# Patient Record
Sex: Male | Born: 1975
Health system: Southern US, Community
[De-identification: ages and names within clinical notes are randomized; demographics above are authoritative.]

## PROBLEM LIST (undated history)

## (undated) ENCOUNTER — Emergency Department (HOSPITAL_BASED_OUTPATIENT_CLINIC_OR_DEPARTMENT_OTHER): Admission: EM | Discharge status: Absent without leave | Payer: Self-pay

## (undated) DIAGNOSIS — G8929 Other chronic pain: Secondary | ICD-10-CM

## (undated) DIAGNOSIS — S069X9A Unspecified intracranial injury with loss of consciousness of unspecified duration, initial encounter: Secondary | ICD-10-CM

## (undated) DIAGNOSIS — M549 Dorsalgia, unspecified: Secondary | ICD-10-CM

## (undated) DIAGNOSIS — L255 Unspecified contact dermatitis due to plants, except food: Secondary | ICD-10-CM

## (undated) DIAGNOSIS — F32A Depression, unspecified: Secondary | ICD-10-CM

## (undated) DIAGNOSIS — E039 Hypothyroidism, unspecified: Secondary | ICD-10-CM

## (undated) DIAGNOSIS — G629 Polyneuropathy, unspecified: Secondary | ICD-10-CM

## (undated) DIAGNOSIS — S5290XA Unspecified fracture of unspecified forearm, initial encounter for closed fracture: Secondary | ICD-10-CM

## (undated) DIAGNOSIS — S069XAA Unspecified intracranial injury with loss of consciousness status unknown, initial encounter: Secondary | ICD-10-CM

## (undated) DIAGNOSIS — J189 Pneumonia, unspecified organism: Secondary | ICD-10-CM

## (undated) DIAGNOSIS — N200 Calculus of kidney: Secondary | ICD-10-CM

## (undated) DIAGNOSIS — M1990 Unspecified osteoarthritis, unspecified site: Secondary | ICD-10-CM

## (undated) DIAGNOSIS — F4310 Post-traumatic stress disorder, unspecified: Secondary | ICD-10-CM

## (undated) DIAGNOSIS — F329 Major depressive disorder, single episode, unspecified: Secondary | ICD-10-CM

## (undated) HISTORY — PX: OTHER SURGICAL HISTORY: SHX169

## (undated) HISTORY — DX: Pneumonia, unspecified organism: J18.9

## (undated) HISTORY — DX: Unspecified fracture of unspecified forearm, initial encounter for closed fracture: S52.90XA

## (undated) HISTORY — PX: REFRACTIVE SURGERY: SHX103

---

## 2006-10-27 ENCOUNTER — Ambulatory Visit: Payer: Self-pay | Admitting: Family Medicine

## 2006-10-29 ENCOUNTER — Ambulatory Visit: Payer: Self-pay | Admitting: Family Medicine

## 2007-04-06 ENCOUNTER — Encounter: Payer: Self-pay | Admitting: Family Medicine

## 2007-06-18 ENCOUNTER — Encounter: Payer: Self-pay | Admitting: Family Medicine

## 2007-06-18 DIAGNOSIS — I781 Nevus, non-neoplastic: Secondary | ICD-10-CM | POA: Insufficient documentation

## 2007-09-18 ENCOUNTER — Encounter: Payer: Self-pay | Admitting: Family Medicine

## 2007-09-22 ENCOUNTER — Ambulatory Visit: Payer: Self-pay | Admitting: Family Medicine

## 2007-09-22 DIAGNOSIS — R799 Abnormal finding of blood chemistry, unspecified: Secondary | ICD-10-CM

## 2007-09-22 DIAGNOSIS — R740 Nonspecific elevation of levels of transaminase and lactic acid dehydrogenase [LDH]: Secondary | ICD-10-CM

## 2007-09-22 DIAGNOSIS — E782 Mixed hyperlipidemia: Secondary | ICD-10-CM

## 2007-09-22 LAB — CONVERTED CEMR LAB
AST: 52 units/L — ABNORMAL HIGH (ref 0–37)
Albumin: 4.7 g/dL (ref 3.5–5.2)
Alkaline Phosphatase: 46 units/L (ref 39–117)
Bilirubin, Direct: <0.1 mg/dL (ref 0.0–0.3)
Total Bilirubin: 0.9 mg/dL (ref 0.3–1.2)

## 2007-09-25 LAB — CONVERTED CEMR LAB
BUN: 16 mg/dL (ref 6–23)
Chloride: 103 meq/L (ref 96–112)
LDL Cholesterol: 218 mg/dL — ABNORMAL HIGH (ref 0–99)
Phosphorus: 3.1 mg/dL (ref 2.3–4.6)
Potassium: 4.7 meq/L (ref 3.5–5.3)
Total CHOL/HDL Ratio: 4.8
VLDL: 18 mg/dL (ref 0–40)

## 2009-02-24 ENCOUNTER — Ambulatory Visit: Payer: Self-pay | Admitting: Family Medicine

## 2009-02-25 LAB — CONVERTED CEMR LAB
ALT: 111 units/L — ABNORMAL HIGH (ref 0–53)
AST: 127 units/L — ABNORMAL HIGH (ref 0–37)
Albumin: 4.5 g/dL (ref 3.5–5.2)
Bilirubin Urine: NEGATIVE
Calcium: 9.1 mg/dL (ref 8.4–10.5)
Eosinophils Relative: 4.9 % (ref 0.0–5.0)
GFR calc non Af Amer: 52.8 mL/min (ref >60)
HCT: 37.5 % — ABNORMAL LOW (ref 39.0–52.0)
Hemoglobin: 13.3 g/dL (ref 13.0–17.0)
Lymphs Abs: 2.1 10*3/uL (ref 0.7–4.0)
MCV: 101.3 fL — ABNORMAL HIGH (ref 78.0–100.0)
Monocytes Absolute: 0.3 10*3/uL (ref 0.1–1.0)
Monocytes Relative: 6.5 % (ref 3.0–12.0)
Neutro Abs: 2 10*3/uL (ref 1.4–7.7)
Platelets: 176 10*3/uL (ref 150.0–400.0)
Potassium: 4.2 meq/L (ref 3.5–5.1)
Sodium: 141 meq/L (ref 135–145)
Specific Gravity, Urine: 1.02 (ref 1.000–1.030)
Total Protein: 7.2 g/dL (ref 6.0–8.3)
Urine Glucose: NEGATIVE mg/dL
WBC: 4.6 10*3/uL (ref 4.5–10.5)
pH: 6 (ref 5.0–8.0)

## 2009-02-27 ENCOUNTER — Ambulatory Visit: Payer: Self-pay | Admitting: Family Medicine

## 2009-02-27 ENCOUNTER — Telehealth: Payer: Self-pay | Admitting: Family Medicine

## 2009-02-27 DIAGNOSIS — N259 Disorder resulting from impaired renal tubular function, unspecified: Secondary | ICD-10-CM | POA: Insufficient documentation

## 2009-02-27 LAB — CONVERTED CEMR LAB
HCV Ab: NEGATIVE
Hep A IgM: NEGATIVE
Hepatitis B Surface Ag: NEGATIVE

## 2009-02-28 ENCOUNTER — Ambulatory Visit: Payer: Self-pay | Admitting: Family Medicine

## 2009-02-28 LAB — CONVERTED CEMR LAB: Sodium, Ur: 51 meq/L

## 2009-03-02 ENCOUNTER — Encounter: Payer: Self-pay | Admitting: Family Medicine

## 2009-03-02 LAB — CONVERTED CEMR LAB
Ferritin: 148 ng/mL (ref 22.0–322.0)
Iron: 74 ug/dL (ref 42–165)
TSH: >100 microintl units/mL — ABNORMAL HIGH (ref 0.35–5.50)
Transferrin: 253.7 mg/dL (ref 212.0–360.0)

## 2009-03-08 ENCOUNTER — Encounter: Payer: Self-pay | Admitting: Family Medicine

## 2009-03-08 DIAGNOSIS — E039 Hypothyroidism, unspecified: Secondary | ICD-10-CM | POA: Insufficient documentation

## 2009-03-08 LAB — CONVERTED CEMR LAB: Free T4: 0.1 ng/dL — ABNORMAL LOW (ref 0.6–1.6)

## 2009-03-09 ENCOUNTER — Telehealth: Payer: Self-pay | Admitting: Family Medicine

## 2009-03-13 ENCOUNTER — Ambulatory Visit: Payer: Self-pay | Admitting: Cardiovascular Disease

## 2009-03-13 ENCOUNTER — Ambulatory Visit: Payer: Self-pay

## 2009-03-13 DIAGNOSIS — R9431 Abnormal electrocardiogram [ECG] [EKG]: Secondary | ICD-10-CM

## 2009-03-15 ENCOUNTER — Ambulatory Visit: Payer: Self-pay

## 2009-03-15 ENCOUNTER — Encounter: Payer: Self-pay | Admitting: Cardiovascular Disease

## 2009-03-16 ENCOUNTER — Telehealth: Payer: Self-pay | Admitting: Cardiovascular Disease

## 2009-03-16 ENCOUNTER — Telehealth: Payer: Self-pay | Admitting: Family Medicine

## 2009-03-17 ENCOUNTER — Encounter (INDEPENDENT_AMBULATORY_CARE_PROVIDER_SITE_OTHER): Payer: Self-pay | Admitting: *Deleted

## 2009-03-27 ENCOUNTER — Ambulatory Visit: Payer: Self-pay | Admitting: Family Medicine

## 2009-04-26 ENCOUNTER — Telehealth: Payer: Self-pay | Admitting: Family Medicine

## 2009-04-26 ENCOUNTER — Encounter: Payer: Self-pay | Admitting: Family Medicine

## 2009-11-14 ENCOUNTER — Telehealth: Payer: Self-pay | Admitting: Family Medicine

## 2009-11-15 ENCOUNTER — Ambulatory Visit: Payer: Self-pay | Admitting: Family Medicine

## 2009-11-20 LAB — CONVERTED CEMR LAB
AST: 24 units/L (ref 0–37)
Albumin: 4 g/dL (ref 3.5–5.2)
Alkaline Phosphatase: 85 units/L (ref 39–117)
BUN: 14 mg/dL (ref 6–23)
CO2: 33 meq/L — ABNORMAL HIGH (ref 19–32)
GFR calc non Af Amer: 90.44 mL/min (ref >60)
Glucose, Bld: 111 mg/dL — ABNORMAL HIGH (ref 70–99)
Potassium: 4.5 meq/L (ref 3.5–5.1)
TSH: 0.28 microintl units/mL — ABNORMAL LOW (ref 0.35–5.50)
Total Protein: 6.8 g/dL (ref 6.0–8.3)

## 2011-03-20 ENCOUNTER — Encounter: Payer: Self-pay | Admitting: Family Medicine

## 2011-03-21 ENCOUNTER — Ambulatory Visit (INDEPENDENT_AMBULATORY_CARE_PROVIDER_SITE_OTHER): Payer: Managed Care, Other (non HMO) | Admitting: Family Medicine

## 2011-03-21 ENCOUNTER — Other Ambulatory Visit: Payer: Managed Care, Other (non HMO)

## 2011-03-21 ENCOUNTER — Encounter: Payer: Self-pay | Admitting: Family Medicine

## 2011-03-21 DIAGNOSIS — E782 Mixed hyperlipidemia: Secondary | ICD-10-CM

## 2011-03-21 DIAGNOSIS — Z0000 Encounter for general adult medical examination without abnormal findings: Secondary | ICD-10-CM

## 2011-03-21 DIAGNOSIS — E039 Hypothyroidism, unspecified: Secondary | ICD-10-CM

## 2011-03-21 DIAGNOSIS — E789 Disorder of lipoprotein metabolism, unspecified: Secondary | ICD-10-CM

## 2011-03-21 DIAGNOSIS — N259 Disorder resulting from impaired renal tubular function, unspecified: Secondary | ICD-10-CM

## 2011-03-21 LAB — HEPATIC FUNCTION PANEL
ALT: 17 U/L (ref 0–53)
AST: 23 U/L (ref 0–37)
Albumin: 3.9 g/dL (ref 3.5–5.2)
Alkaline Phosphatase: 72 U/L (ref 39–117)
Total Protein: 6.5 g/dL (ref 6.0–8.3)

## 2011-03-21 LAB — LIPID PANEL
Cholesterol: 164 mg/dL (ref 0–200)
VLDL: 13.4 mg/dL (ref 0.0–40.0)

## 2011-03-21 LAB — BASIC METABOLIC PANEL
BUN: 19 mg/dL (ref 6–23)
CO2: 27 mEq/L (ref 19–32)
GFR: 92.96 mL/min (ref >60.00)
Glucose, Bld: 102 mg/dL — ABNORMAL HIGH (ref 70–99)
Potassium: 4.2 mEq/L (ref 3.5–5.1)

## 2011-03-21 LAB — T4, FREE: Free T4: 1.13 ng/dL (ref 0.60–1.60)

## 2011-03-21 MED ORDER — LEVOTHYROXINE SODIUM 150 MCG PO TABS: 150.0000 ug | ORAL_TABLET | Freq: Every day | ORAL | Status: DC

## 2011-03-21 NOTE — Progress Notes (Signed)
35 year old male for CPX:  Preventative Health Maintenance Visit:  Health Maintenance Summary Reviewed and updated, unless pt declines services.  Tobacco History Reviewed. Alcohol: No concerns, no excessive use Exercise Habits: Some activity, rec at least 30 mins 5 times a week STD concerns: no risk or activity to increase risk Drug Use: None Encouraged self-testicular check  The PMH, PSH, Social History, Family History, Medications, and allergies have been reviewed in [facility], and have been updated if relevant.  General: Denies fever, chills, sweats. No significant weight loss. Eyes: Denies blurring,significant itching ENT: Denies earache, sore throat, and hoarseness. Cardiovascular: Denies chest pains, palpitations, dyspnea on exertion Respiratory: Denies cough, dyspnea at rest,wheeezing Breast: no concerns about lumps GI: Denies nausea, vomiting, diarrhea, constipation, change in bowel habits, abdominal pain, melena, hematochezia GU: Denies penile discharge, ED, urinary flow / outflow problems. No STD concerns. Musculoskeletal: Denies back pain, joint pain Derm: Denies rash, itching Neuro: Denies  paresthesias, frequent falls, frequent headaches Psych: Denies depression, anxiety Endocrine: Denies cold intolerance, heat intolerance, polydipsia Heme: Denies enlarged lymph nodes Allergy: No hayfever  PE: GEN: well developed, well nourished, no acute distress Eyes: conjunctiva and lids normal, PERRLA, EOMI ENT: TM clear, nares clear, oral exam WNL Neck: supple, no lymphadenopathy, no thyromegaly, no JVD Pulm: clear to auscultation and percussion, respiratory effort normal CV: regular rate and rhythm, S1-S2, no murmur, rub or gallop, no bruits, peripheral pulses normal and symmetric, no cyanosis, clubbing, edema or varicosities Chest: no scars, masses, no gynecomastia   GI: soft, non-tender; no hepatosplenomegaly, masses; active bowel sounds all quadrants GU: no hernia, testicular  mass, penile discharge Lymph: no cervical, axillary or inguinal adenopathy MSK: gait normal, muscle tone and strength WNL, no joint swelling, effusions, discoloration, crepitus  SKIN: clear, good turgor, color WNL, no rashes, lesions, or ulcerations Neuro: normal mental status, normal strength, sensation, and motion Psych: alert; oriented to person, place and time, normally interactive and not anxious or depressed in appearance.  A/P: Well exam  The patient's preventative maintenance and recommended screening tests for an annual wellness exam were reviewed in full today. Brought up to date unless services declined.  Counselled on the importance of diet, exercise, and its role in overall health and mortality. The patient's FH and SH was reviewed, including their home life, tobacco status, and drug and alcohol status.  Follow-up on prior lab abn and repeat for wellness

## 2011-04-05 NOTE — Assessment & Plan Note (Signed)
[facility]                           [facility] OFFICE NOTE   GLENWOOD, REVOIR                             MRN:          [id number]  DATE:10/27/2006                            DOB:          [birth year]    CHIEF COMPLAINT:  A 35 year old white male here to establish new  physician.   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 He comes to clinic today to look  into some right knee pain that he has been having since an injury in  April 2007. He states at that time he was on the job and stepped out of  a moving vehicle. When this happened, he twisted his right knee  abruptly. After that, he stated, he had right knee pain with mild  swelling. He was seen by a physician at [facility], who examined  his knee on February 25, 2006. They had reported minor swelling, no  effusion, some crepitus, full range of motion. At that point in time,  ice, rest, and NSAIDs were recommended. He states that this helped  initially, but approximately 1 month ago the pain returned. He then  followed up again with [facility] on September 23, 2006. At that  point in time, he was examined and had no effusion, no joint line  tenderness, negative Lachman's, negative patellar apprehension, and full  range of motion. It was felt that it was most likely secondary to  chondromalacia patella. Quadriceps strengthening exercises, NSAIDs as  well as followup with a PCP in the [place] were recommended. Robert Mathews reports  that he continues to have pain, mainly with going up stairs. The  tenderness is primarily inferior to his right patella. He has  occasionally noticed grinding and popping with flexion. Occasionally it  has been swollen. He used to run more frequently, but has cut that back  and started using an elliptical trainer to eliminate extra stress on his  knee. He occasionally wears a brace and uses NSAIDs p.r.n. He  also takes  glucosamine and chondroitin.   REVIEW OF SYSTEMS:  Otherwise pertinent negative.   PAST MEDICAL HISTORY:  None.   HOSPITALIZATIONS, SURGERIES, PROCEDURES:  1. Pneumonia x2.  2. In 1988, radius fracture.  3. Tetanus in 2005.   ALLERGIES:  None.   MEDICATIONS:  1. Glucosamine chondroitin 1500 mg a day.  2. Multivitamins daily.   FAMILY HISTORY:  Father alive at age 33 with alcoholism and  hypertension. Mother alive at age 62 and the patient has no contact with  her and does not know past medical history. He has maternal grandfather  with heart attack and CABG as well as a paternal grandfather with  history of CABG. He denies any MI before age 29. He has several family  members with high blood pressure. A paternal grandfather has type 2  diabetes and a maternal grandmother has type 1 diabetes. He has two  sisters who are healthy. He has an aunt with breast cancer, but no other  cancer in the  family.   SOCIAL HISTORY:  He works as a Camera operator. He has been  married for 1-1/2 years and states this is going well. He has one  daughter, who is healthy at age 64. He gets exercise very frequently with  weight lifting as well as aerobic exercise 3-5 times per week. He eats  fruits, vegetables and tries to get extra protein. He drinks about 3  quarts of water per day.   PHYSICAL EXAMINATION:  VITAL SIGNS: Height 74-1/2 inches. Weight 219.  Blood pressure 108/70, pulse 68, temperature 97.7.  GENERAL: Healthy-appearing, physically fit male in no apparent distress.  HEENT: PERRLA. Extraocular muscles intact. Oropharynx clear. Tympanic  membranes clear. Nares clear. No thyromegaly. No lymphadenopathy,  cervical or supraclavicular.  CARDIOVASCULAR: Regular rate and rhythm. No murmurs, rubs or gallops.  PULMONARY: Clear to auscultation bilaterally. No wheezes, rales or  rhonchi.  ABDOMEN: Soft and nontender. Normoactive bowel sounds. No  hepatosplenomegaly.   MUSCULOSKELETAL: Strength 5/5 in upper and lower extremities. Range of  motion full in bilateral knees as well as shoulders. Tenderness to  palpation inferior to right patella, no joint line tenderness. Negative  McMurray's, negative Lachman's, negative anterior and posterior drawer,  knee joint very stable, there is crepitus with flexion. Reflexes 2+  patellar bilaterally. No patellar apprehension.  NEURO:  Cranial nerves II-XII grossly intact. Alert and oriented x3.  PSYCH: No suicidal or homicidal ideation. Appropriate affect.   ASSESSMENT AND PLAN:  1. Knee pain:  Following exam, this does seem to be most likely      secondary to patellofemoral syndrome. The patient was given      information on exercises to perform to strengthen the vastus      medialis obliquus muscle. He was also instructed to use      nonsteroidal anti-inflammatory drugs and glucosamine as needed. He      may also ice his knee as needed. He was instructed to avoid high-      impact aerobic activity. We will check an x-ray to determine that      there is no bony abnormality. If his symptoms do continue over the      next 3 months, he will return during his next leave and we can      consider looking further with an MRI, but at this point in time, I      do feel that there is no evidence of meniscal or structural injury.  2. Prevention: The patient refused flu vaccine today. He is up-to-date      with tetanus. Given his exposure overseas, we will have him return      for a PPD. He is also due for a cholesterol baseline. He has also      requested an HIV panel. All of these will be done at a separate lab      appointment, billed separately.     Robert Nora, MD  Electronically Signed    AB/MedQ  DD: 10/27/2006  DT: 10/27/2006  Job #: [id number]

## 2011-06-06 ENCOUNTER — Other Ambulatory Visit: Payer: Self-pay | Admitting: Family Medicine

## 2011-10-18 ENCOUNTER — Other Ambulatory Visit: Payer: Self-pay | Admitting: Family Medicine

## 2011-11-28 ENCOUNTER — Telehealth: Payer: Self-pay | Admitting: Internal Medicine

## 2011-11-28 NOTE — Telephone Encounter (Signed)
Patient called and said he has an appointment on the 28th of January and would like a referral for post traumatic stress and wanted you to know so you could put some names together for him.

## 2011-11-28 NOTE — Telephone Encounter (Signed)
Shirlee Limerick are you aware of any counselor or psychiatrists that specialize in this?

## 2011-11-29 NOTE — Telephone Encounter (Signed)
No I am not aware of anyone that specializes in Post Tramatic Stress, Dr Ermalene Searing. You could ask Dr Laymond Purser she might know.

## 2011-11-29 NOTE — Telephone Encounter (Signed)
Dr. Laymond Purser.. Do you know?

## 2011-12-16 ENCOUNTER — Ambulatory Visit (INDEPENDENT_AMBULATORY_CARE_PROVIDER_SITE_OTHER): Payer: 59 | Admitting: Family Medicine

## 2011-12-16 ENCOUNTER — Encounter: Payer: Self-pay | Admitting: Family Medicine

## 2011-12-16 DIAGNOSIS — E039 Hypothyroidism, unspecified: Secondary | ICD-10-CM

## 2011-12-16 DIAGNOSIS — F4310 Post-traumatic stress disorder, unspecified: Secondary | ICD-10-CM

## 2011-12-16 LAB — T3, FREE: T3, Free: 2.8 pg/mL (ref 2.3–4.2)

## 2011-12-16 NOTE — Assessment & Plan Note (Signed)
Referred to counselor.. Dr. Laymond Purser.

## 2011-12-16 NOTE — Progress Notes (Signed)
  Subjective:    Patient ID: Robert Mathews, male    DOB: June 23, 1976, 36 y.o.   MRN: [id number]  HPI  36 year old male here for follow up on hypothyroidism. On levothyroxine 150 mcg daily.  Lab Results  Component Value Date   TSH 0.13* 03/21/2011  Free T4 was normal. Had CPX with Dr. Patsy Lager 03/2011.  Has noted with cardio with lifting.Marland KitchenMarland KitchenFeeling more lightheaded, HR more elevated at 185 Ran out of 150 went back to 100 mcg... Felt better but started having mental fogginess. Went back to 150 mcg. No weight change.  He has been in [place] in the last year. Working Designer, jewellery in Visual merchandiser. Has had some issues with PTSD.Marland Kitchen [facility] Wants local referral to counselor.  Mild depression noted, on SI, no HI.  Pt doesn't want med for treatment... Just referral.     Review of Systems  Constitutional: Negative for fever and fatigue.  HENT: Negative for ear pain.   Eyes: Negative for pain.  Respiratory: Negative for shortness of breath.   Cardiovascular: Negative for chest pain, palpitations and leg swelling.  Gastrointestinal: Negative for abdominal pain.  Psychiatric/Behavioral: The patient is not nervous/anxious.        Objective:   Physical Exam  Constitutional: Vital signs are normal. He appears well-developed and well-nourished.  HENT:  Head: Normocephalic.  Right Ear: Hearing normal.  Left Ear: Hearing normal.  Nose: Nose normal.  Mouth/Throat: Oropharynx is clear and moist and mucous membranes are normal.  Neck: Trachea normal. Carotid bruit is not present. No mass and no thyromegaly present.  Cardiovascular: Normal rate, regular rhythm and normal pulses.  Exam reveals no gallop, no distant heart sounds and no friction rub.   No murmur heard.      No peripheral edema  Pulmonary/Chest: Effort normal and breath sounds normal. No respiratory distress.  Skin: Skin is warm, dry and intact. No rash noted.  Psychiatric: He has a normal mood  and affect. His speech is normal and behavior is normal. Judgment and thought content normal.          Assessment & Plan:

## 2011-12-16 NOTE — Patient Instructions (Signed)
We will call with lab results.  Stop at front desk to get referral info from [facility].

## 2011-12-16 NOTE — Assessment & Plan Note (Signed)
?   Symptoms of thyroid being overtreated.. Increased HR, dizziness.  Will re-eval TSH and free T3 and T4. May need middle dose around 125 mcg daily.

## 2011-12-18 ENCOUNTER — Telehealth: Payer: Self-pay | Admitting: Family Medicine

## 2011-12-19 MED ORDER — LEVOTHYROXINE SODIUM 125 MCG PO TABS: 125.0000 ug | ORAL_TABLET | Freq: Every day | ORAL | Status: DC

## 2011-12-19 NOTE — Progress Notes (Signed)
Addended by: Consuello Masse on: 12/19/2011 04:30 PM   Modules accepted: Orders

## 2011-12-31 ENCOUNTER — Ambulatory Visit: Payer: 59 | Admitting: Psychology

## 2012-01-01 ENCOUNTER — Other Ambulatory Visit: Payer: Self-pay | Admitting: Family Medicine

## 2012-01-01 DIAGNOSIS — E039 Hypothyroidism, unspecified: Secondary | ICD-10-CM

## 2012-01-09 ENCOUNTER — Other Ambulatory Visit: Payer: 59

## 2012-05-04 ENCOUNTER — Other Ambulatory Visit: Payer: Self-pay | Admitting: Family Medicine

## 2012-05-04 NOTE — Telephone Encounter (Addendum)
Received refill request electronically from pharmacy. Medication refill request does not match medication sheet. See office note dated 12/16/11.

## 2012-05-05 MED ORDER — LEVOTHYROXINE SODIUM 150 MCG PO TABS: 150.0000 ug | ORAL_TABLET | Freq: Every day | ORAL | Status: DC

## 2012-05-05 MED ORDER — LEVOTHYROXINE SODIUM 125 MCG PO TABS: 125.0000 ug | ORAL_TABLET | Freq: Every day | ORAL | Status: DC

## 2012-05-05 NOTE — Telephone Encounter (Signed)
He was changesd to 125 mcg daily.., due for TSH , t3 and t4 check if he is back in town.

## 2012-05-05 NOTE — Telephone Encounter (Signed)
Pt states that he never started taking the 125 mg's, continued with 150 mcg's instead, and when he ran out of those he started taking some 200 mcg's that he had on hand.  Per Dr. Ermalene Searing, advised pt to stop taking the 200's and throw them away.  150 mcgs sent to pharmacy.  Lab appt made for pt to come in in 3 weeks to recheck thyroid levels.

## 2012-05-27 ENCOUNTER — Other Ambulatory Visit: Payer: 59

## 2012-06-01 ENCOUNTER — Other Ambulatory Visit (INDEPENDENT_AMBULATORY_CARE_PROVIDER_SITE_OTHER): Payer: 59

## 2012-06-01 DIAGNOSIS — E039 Hypothyroidism, unspecified: Secondary | ICD-10-CM

## 2012-06-01 LAB — T3, FREE: T3, Free: 2.7 pg/mL (ref 2.3–4.2)

## 2012-06-03 LAB — T4, FREE: Free T4: 1.23 ng/dL (ref 0.60–1.60)

## 2013-06-02 ENCOUNTER — Other Ambulatory Visit: Payer: Self-pay | Admitting: Family Medicine

## 2013-06-29 ENCOUNTER — Other Ambulatory Visit: Payer: Self-pay | Admitting: Family Medicine

## 2013-06-29 NOTE — Telephone Encounter (Signed)
Refill request for levothyroxine.  This was sent to pharmacy on 7/16 for # 90 with 1 refill.  I told pharmacy to cancel that script and ok'd for #30 this time only, pt must have office visit before further refills.  Pt has not been seen since 11/2011.

## 2013-07-15 ENCOUNTER — Encounter: Payer: Self-pay | Admitting: Family Medicine

## 2013-07-15 ENCOUNTER — Ambulatory Visit (INDEPENDENT_AMBULATORY_CARE_PROVIDER_SITE_OTHER): Payer: 59 | Admitting: Family Medicine

## 2013-07-15 VITALS — BP 130/80 | HR 86 | Temp 98.2°F | Ht 74.0 in | Wt 224.2 lb

## 2013-07-15 DIAGNOSIS — E039 Hypothyroidism, unspecified: Secondary | ICD-10-CM

## 2013-07-15 DIAGNOSIS — M549 Dorsalgia, unspecified: Secondary | ICD-10-CM

## 2013-07-15 DIAGNOSIS — G8929 Other chronic pain: Secondary | ICD-10-CM

## 2013-07-15 NOTE — Progress Notes (Signed)
  Subjective:    Patient ID: Ethlyn Gallery, male    DOB: January 30, 1976, 37 y.o.   MRN: [id number]  HPI  37 year old male with history of hypothyroidism presents for follow up.  He denys any issues. He is doing well overall. Taking generic levo 150 mcg daily.  Lab Results  Component Value Date   TSH 0.74 06/01/2012   He is requested polio ( IPV)  Booster given he is spending time working in [place].   He is been treated by [facility] for chronic back pain.     Review of Systems  Constitutional: Negative for fever and fatigue.  HENT: Negative for ear pain.   Eyes: Negative for pain.  Respiratory: Negative for shortness of breath.   Cardiovascular: Negative for chest pain, palpitations and leg swelling.  Gastrointestinal: Negative for abdominal pain.       Objective:   Physical Exam  Constitutional: Vital signs are normal. He appears well-developed and well-nourished.  HENT:  Head: Normocephalic.  Right Ear: Hearing normal.  Left Ear: Hearing normal.  Nose: Nose normal.  Mouth/Throat: Oropharynx is clear and moist and mucous membranes are normal.  Neck: Trachea normal. Carotid bruit is not present. No mass and no thyromegaly present.  Cardiovascular: Normal rate, regular rhythm and normal pulses.  Exam reveals no gallop, no distant heart sounds and no friction rub.   No murmur heard. No peripheral edema  Pulmonary/Chest: Effort normal and breath sounds normal. No respiratory distress.  Skin: Skin is warm, dry and intact. No rash noted.  Psychiatric: He has a normal mood and affect. His speech is normal and behavior is normal. Thought content normal.          Assessment & Plan:

## 2013-07-15 NOTE — Patient Instructions (Addendum)
Stop at lab on your way out.

## 2013-07-15 NOTE — Assessment & Plan Note (Signed)
Due for re-eval. 

## 2013-07-16 ENCOUNTER — Other Ambulatory Visit: Payer: Self-pay | Admitting: *Deleted

## 2013-07-16 MED ORDER — LEVOTHYROXINE SODIUM 150 MCG PO TABS: ORAL_TABLET | ORAL | Status: DC

## 2013-07-16 NOTE — Telephone Encounter (Signed)
Instructed by Dr. Ermalene Searing to send in Rx for Levothyroxine for 1 year.  Ileana Ladd

## 2013-09-08 ENCOUNTER — Encounter: Payer: Self-pay | Admitting: Family Medicine

## 2013-09-14 ENCOUNTER — Ambulatory Visit (INDEPENDENT_AMBULATORY_CARE_PROVIDER_SITE_OTHER): Payer: 59 | Admitting: Family Medicine

## 2013-09-14 ENCOUNTER — Encounter: Payer: Self-pay | Admitting: Family Medicine

## 2013-09-14 ENCOUNTER — Ambulatory Visit (INDEPENDENT_AMBULATORY_CARE_PROVIDER_SITE_OTHER)
Admission: RE | Admit: 2013-09-14 | Discharge: 2013-09-14 | Disposition: A | Discharge status: Home / Self Care | Payer: 59 | Source: Ambulatory Visit | Attending: Family Medicine | Admitting: Family Medicine

## 2013-09-14 VITALS — BP 120/90 | HR 88 | Temp 98.2°F | Ht 74.0 in | Wt 218.5 lb

## 2013-09-14 DIAGNOSIS — M545 Low back pain: Secondary | ICD-10-CM

## 2013-09-14 DIAGNOSIS — M546 Pain in thoracic spine: Secondary | ICD-10-CM

## 2013-09-14 MED ORDER — PREDNISONE 20 MG PO TABS: 60.0000 mg | ORAL_TABLET | Freq: Every day | ORAL | Status: DC

## 2013-09-14 MED ORDER — TRAMADOL HCL ER 100 MG PO TB24: 100.0000 mg | ORAL_TABLET | Freq: Every day | ORAL | Status: DC

## 2013-09-14 MED ORDER — CYCLOBENZAPRINE HCL 10 MG PO TABS: 10.0000 mg | ORAL_TABLET | Freq: Every evening | ORAL | Status: DC | PRN

## 2013-09-14 NOTE — Patient Instructions (Signed)
Apply heat, gentle stretching exercises as tolerated. Start prednisone taper, tramadol for pain and muscle relaxant prn. We will call with X-ray results. Follow up in 2 weeks.. Call sooner if pain not improving some.

## 2013-09-14 NOTE — Progress Notes (Signed)
  Subjective:    Patient ID: Robert Mathews, male    DOB: 1975-11-29, 37 y.o.   MRN: [id number]  HPI  37 year old male with history of chronic low back pain presents with acute flare of low back pain.   I past [facility]... [facility]  Had X-rays in past there.. ? Results.   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 Low back severe pain, no radiation to legs but sore over iliac crest on right.  Hurts more sitting, bending over, jarring moton   Med evac'd out of [place] last week. Given lidocaine, torodal in back, treated with prednsione x 2 days. Helped some. Unable to do X-rays. Given 100 mg tramadol ER, he is out now.. Was helping 50% improvement. Still with constant pain and decreased ROM.  Awaiting work claim to go through. Plans to see specialist at that point.   No history of back surgery.    Review of Systems  Constitutional: Negative for fever and fatigue.  HENT: Negative for ear pain.   Eyes: Negative for pain.  Respiratory: Negative for shortness of breath.   Cardiovascular: Negative for chest pain.       Objective:   Physical Exam  Constitutional: Vital signs are normal. He appears well-developed and well-nourished.  HENT:  Head: Normocephalic.  Right Ear: Hearing normal.  Left Ear: Hearing normal.  Nose: Nose normal.  Mouth/Throat: Oropharynx is clear and moist and mucous membranes are normal.  Neck: Trachea normal. Carotid bruit is not present. No mass and no thyromegaly present.  Cardiovascular: Normal rate, regular rhythm and normal pulses.  Exam reveals no gallop, no distant heart sounds and no friction rub.   No murmur heard. No peripheral edema  Pulmonary/Chest: Effort normal and breath sounds normal. No respiratory distress.  Musculoskeletal:       Thoracic back: He exhibits decreased range of motion, tenderness and bony tenderness. He exhibits no  swelling.       Lumbar back: He exhibits decreased range of motion, tenderness and bony tenderness.  Positive SLR on right Unable to bend forward or back.  Neurological: He has normal strength. No cranial nerve deficit or sensory deficit. Gait abnormal.  Skin: Skin is warm, dry and intact. No rash noted.  Psychiatric: He has a normal mood and affect. His speech is normal and behavior is normal. Thought content normal.          Assessment & Plan:  Acvute back injury with evidence of right sided radiculopathy.  Eval with plain films.  Treat with steroids, muscle relaxant, pain med and gentle stretching and heat.  Follow up inf not improving.. Hollyanne Schloesser need referral to PT and or MRI for further eval.

## 2013-09-16 ENCOUNTER — Encounter: Payer: Self-pay | Admitting: Family Medicine

## 2013-09-23 ENCOUNTER — Other Ambulatory Visit: Payer: Self-pay

## 2013-09-27 ENCOUNTER — Encounter: Payer: Self-pay | Admitting: Family Medicine

## 2013-09-28 ENCOUNTER — Other Ambulatory Visit: Payer: Self-pay | Admitting: Family Medicine

## 2013-09-28 ENCOUNTER — Encounter: Payer: Self-pay | Admitting: Family Medicine

## 2013-09-28 DIAGNOSIS — M5416 Radiculopathy, lumbar region: Secondary | ICD-10-CM

## 2013-09-28 MED ORDER — MELOXICAM 15 MG PO TABS: 15.0000 mg | ORAL_TABLET | Freq: Every day | ORAL | Status: DC

## 2013-09-28 NOTE — Addendum Note (Signed)
Addended by: Kerby Nora E on: 09/28/2013 04:46 PM   Modules accepted: Orders

## 2013-09-29 NOTE — Telephone Encounter (Signed)
Last office visit 09/14/2013.  Ok to refill? 

## 2013-10-16 ENCOUNTER — Other Ambulatory Visit: Payer: Self-pay | Admitting: Family Medicine

## 2013-10-17 ENCOUNTER — Other Ambulatory Visit: Payer: 59

## 2013-10-17 NOTE — Telephone Encounter (Signed)
Last office visit 09/14/2013.  Ok to refill? 

## 2013-10-19 ENCOUNTER — Encounter: Payer: Self-pay | Admitting: Family Medicine

## 2013-10-19 MED ORDER — TRAMADOL HCL ER 100 MG PO TB24: 100.0000 mg | ORAL_TABLET | Freq: Every day | ORAL | Status: DC

## 2013-10-22 ENCOUNTER — Ambulatory Visit
Admission: RE | Admit: 2013-10-22 | Discharge: 2013-10-22 | Disposition: A | Discharge status: Home / Self Care | Payer: Worker's Compensation | Source: Ambulatory Visit | Attending: Family Medicine | Admitting: Family Medicine

## 2013-10-22 DIAGNOSIS — M5416 Radiculopathy, lumbar region: Secondary | ICD-10-CM

## 2013-10-26 ENCOUNTER — Other Ambulatory Visit: Payer: Self-pay | Admitting: Family Medicine

## 2013-11-01 ENCOUNTER — Other Ambulatory Visit: Payer: Self-pay | Admitting: Family Medicine

## 2013-11-01 ENCOUNTER — Telehealth: Payer: Self-pay

## 2013-11-01 NOTE — Telephone Encounter (Signed)
Last office visit 09/14/2013.  Ok to refill? 

## 2013-11-01 NOTE — Telephone Encounter (Signed)
Robert Mathews case Production designer, theatre/television/film with worker's comp request written order for physical therapy faxed to [facility] at [phone].

## 2013-11-02 NOTE — Telephone Encounter (Signed)
In outbox

## 2013-11-02 NOTE — Telephone Encounter (Signed)
PT orders faxed to [facility] at [phone].

## 2013-11-15 ENCOUNTER — Ambulatory Visit (INDEPENDENT_AMBULATORY_CARE_PROVIDER_SITE_OTHER): Payer: Worker's Compensation | Admitting: Family Medicine

## 2013-11-15 ENCOUNTER — Encounter: Payer: Self-pay | Admitting: Family Medicine

## 2013-11-15 VITALS — BP 140/94 | HR 78 | Temp 98.1°F | Ht 74.0 in | Wt 227.0 lb

## 2013-11-15 DIAGNOSIS — M545 Low back pain: Secondary | ICD-10-CM

## 2013-11-15 DIAGNOSIS — F4321 Adjustment disorder with depressed mood: Secondary | ICD-10-CM | POA: Insufficient documentation

## 2013-11-15 NOTE — Assessment & Plan Note (Signed)
We discussed options , he will proceed with PT. Follow up in several weeks, if not improving by then or earlier if pt interested we will refer for epidural spinal injections.  Continue tramadol ER an meloxicam for pain and inflammation.

## 2013-11-15 NOTE — Progress Notes (Signed)
Pre-visit discussion using our clinic review tool. No additional management support is needed unless otherwise documented below in the visit note.  

## 2013-11-15 NOTE — Patient Instructions (Signed)
Stop at front desk to discuss referral with referral specialist [facility]. Follow up in 3-4 weeks.

## 2013-11-15 NOTE — Assessment & Plan Note (Signed)
If not improved with return to acitvity at [facility]... We will refer to counselor.

## 2013-11-15 NOTE — Progress Notes (Signed)
Subjective:    Patient ID: Robert Mathews, male    DOB: 08-08-1976, 37 y.o.   MRN: [id number]  HPI 36 year old male with avute lumbar spine injury now chronic  given ongoing x 2 months presents for discussusion of MRi spine.   Hx is as follows.. 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 Low back severe pain, no radiation to legs but sore over iliac crest on right.  Hurts more sitting, bending over, jarring moton  Med evac'd out of [place] the following week.  Given lidocaine, torodal in back, treated with prednsione x 2 days. Helped some.  Unable to do X-rays.  Given 100 mg tramadol ER  No history of back surgery.  Xray lumbar 10/28 : IMPRESSION:  Mild degenerative disc disease changes lower lumbar spine.  No acute abnormalities.  He has been maintained with tramadol ER for pain following a course of prednisone for pain.  MRI lumbar 12/5: IMPRESSION:  1. Mild to moderate lateral recess and foraminal narrowing  bilaterally at L3-4.  2. Mild central canal stenosis at L4-5 with mild to moderate  foraminal stenosis bilaterally, worse on the right.  3. Moderate foraminal stenosis bilaterally at L5-S1 without  significant central canal stenosis.  4. Chronic endplate changes and degenerative loss of endplate height  at L4-5.  In the following weeks  He has not been able to start PT yet.  he presents with Olena Heckle from his job for worker comp. Today he reports:  The he has a pain level of 4/10  As long as he takes tramadol daily as well as the meloxicam. Pain worse with twisting  Side to side, walking down stairs. Continued tingling on left leg, no new weakness. No incontinence, no fever, no perineal numbness.    Since he  Has been inactive.. He has been experiencing decreased motivation, short tempered, moody.  he has been sad due to passing of  grandfather as well.  No anhedonia. No SI, no HI. Some insomnia that he feels is due to the tramadol.  If these symptoms do not improve with physical exercise at [facility], then we will refer him to psychologist.  Never on any med for mood.    Review of Systems  Constitutional: Negative for fatigue.  HENT: Negative for ear pain.   Eyes: Negative for pain.  Respiratory: Negative for cough and shortness of breath.   Cardiovascular: Negative for chest pain.  Gastrointestinal: Negative for abdominal pain.       He has gained 10 lbs since last OV given inactivity.       Objective:   Physical Exam  Constitutional: Vital signs are normal. He appears well-developed and well-nourished.  HENT:  Head: Normocephalic.  Right Ear: Hearing normal.  Left Ear: Hearing normal.  Nose: Nose normal.  Mouth/Throat: Oropharynx is clear and moist and mucous membranes are normal.  Neck: Trachea normal. Carotid bruit is not present. No mass and no thyromegaly present.  Cardiovascular: Normal rate, regular rhythm and normal pulses.  Exam reveals no gallop, no distant heart sounds and no friction rub.   No murmur heard. No peripheral edema  Pulmonary/Chest: Effort normal and breath sounds normal. No respiratory distress.  Musculoskeletal:       Thoracic back: He exhibits normal range of motion, no tenderness, no bony tenderness and no swelling.  Lumbar back: He exhibits decreased range of motion, tenderness and bony tenderness.  negative SLR on right Unable to bend forward or back.  Neurological: He has normal strength. He displays no atrophy. No cranial nerve deficit or sensory deficit. He exhibits normal muscle tone. Gait abnormal. Coordination normal.  Skin: Skin is warm, dry and intact. No rash noted.  Psychiatric: He has a normal mood and affect. His speech is normal and behavior is normal. Thought content normal.          Assessment & Plan:

## 2013-11-16 ENCOUNTER — Encounter: Payer: Self-pay | Admitting: Family Medicine

## 2013-11-16 ENCOUNTER — Telehealth: Payer: Self-pay | Admitting: Family Medicine

## 2013-11-16 NOTE — Telephone Encounter (Signed)
Pt dropped off form to be filled out. I put them in Dr. Senaida Lange inbox on her desk.

## 2013-11-16 NOTE — Telephone Encounter (Signed)
Will complete in next few days.

## 2013-11-19 ENCOUNTER — Encounter: Payer: Self-pay | Admitting: Family Medicine

## 2013-11-19 DIAGNOSIS — M5416 Radiculopathy, lumbar region: Secondary | ICD-10-CM | POA: Insufficient documentation

## 2013-11-19 DIAGNOSIS — IMO0002 Reserved for concepts with insufficient information to code with codable children: Secondary | ICD-10-CM | POA: Insufficient documentation

## 2013-11-19 DIAGNOSIS — Z0279 Encounter for issue of other medical certificate: Secondary | ICD-10-CM

## 2013-11-19 DIAGNOSIS — M48061 Spinal stenosis, lumbar region without neurogenic claudication: Secondary | ICD-10-CM | POA: Insufficient documentation

## 2013-11-22 ENCOUNTER — Encounter: Payer: Self-pay | Admitting: Family Medicine

## 2013-11-23 MED ORDER — TRAMADOL HCL ER 100 MG PO TB24: 100.0000 mg | ORAL_TABLET | Freq: Every day | ORAL | Status: DC

## 2013-11-23 MED ORDER — MELOXICAM 15 MG PO TABS: ORAL_TABLET | ORAL | Status: DC

## 2013-11-23 NOTE — Telephone Encounter (Signed)
Tramadol called to [facility] [address] 

## 2013-12-02 ENCOUNTER — Telehealth: Payer: Self-pay

## 2013-12-02 NOTE — Telephone Encounter (Signed)
Pat with [facility] left v/m requesting order for Dexamethasone. Dennie Bibleat has already faxed form and Dexamethasone prescription for signature from [facility].

## 2013-12-14 ENCOUNTER — Ambulatory Visit: Payer: Worker's Compensation | Admitting: Family Medicine

## 2013-12-20 ENCOUNTER — Telehealth: Payer: Self-pay | Admitting: *Deleted

## 2013-12-20 DIAGNOSIS — M545 Low back pain, unspecified: Secondary | ICD-10-CM

## 2013-12-20 DIAGNOSIS — M549 Dorsalgia, unspecified: Principal | ICD-10-CM

## 2013-12-20 DIAGNOSIS — G8929 Other chronic pain: Secondary | ICD-10-CM

## 2013-12-20 DIAGNOSIS — M5416 Radiculopathy, lumbar region: Secondary | ICD-10-CM

## 2013-12-20 DIAGNOSIS — IMO0002 Reserved for concepts with insufficient information to code with codable children: Secondary | ICD-10-CM

## 2013-12-20 DIAGNOSIS — M48061 Spinal stenosis, lumbar region without neurogenic claudication: Secondary | ICD-10-CM

## 2013-12-20 NOTE — Telephone Encounter (Signed)
Referral sent 

## 2013-12-20 NOTE — Telephone Encounter (Signed)
[facility] calling stating that pt is having a hard time with physical therapy and would like referral to an orthopedist. Please advise

## 2013-12-22 ENCOUNTER — Encounter: Payer: Self-pay | Admitting: Family Medicine

## 2013-12-22 ENCOUNTER — Other Ambulatory Visit: Payer: Self-pay | Admitting: Family Medicine

## 2013-12-22 NOTE — Telephone Encounter (Signed)
Last office visit 11/15/2013.  Ok to refill? 

## 2013-12-23 MED ORDER — TRAMADOL HCL ER 100 MG PO TB24: 100.0000 mg | ORAL_TABLET | Freq: Every day | ORAL | Status: DC

## 2013-12-23 NOTE — Telephone Encounter (Signed)
Called to [facility] [address] 

## 2013-12-30 ENCOUNTER — Ambulatory Visit: Payer: Worker's Compensation | Admitting: Family Medicine

## 2014-01-10 ENCOUNTER — Encounter: Payer: Self-pay | Admitting: Family Medicine

## 2014-01-10 DIAGNOSIS — F329 Major depressive disorder, single episode, unspecified: Secondary | ICD-10-CM

## 2014-01-11 NOTE — Telephone Encounter (Signed)
The referral can be processed through my case manager, Maxie BetterAmanda Ratcliff, [phone] or [phone].

## 2014-01-19 ENCOUNTER — Encounter: Payer: Self-pay | Admitting: Family Medicine

## 2014-01-19 ENCOUNTER — Other Ambulatory Visit: Payer: Self-pay | Admitting: Family Medicine

## 2014-01-19 NOTE — Telephone Encounter (Signed)
Last office visit 11/15/2013.  Ok to refill? 

## 2014-01-20 MED ORDER — TRAMADOL HCL ER 100 MG PO TB24: 100.0000 mg | ORAL_TABLET | Freq: Every day | ORAL | Status: DC

## 2014-01-20 NOTE — Telephone Encounter (Signed)
Called to [facility] [address] 

## 2014-02-10 ENCOUNTER — Telehealth: Payer: Self-pay

## 2014-02-10 NOTE — Telephone Encounter (Signed)
[employer] notified patient is currently taking cyclobenzaprine, meloxicam and tramadol.

## 2014-02-10 NOTE — Telephone Encounter (Signed)
[facility] left v/m; wanting to know if pt is currently taking cyclobenzaprine, meloxicam and tramadol.Please advise.

## 2014-02-10 NOTE — Telephone Encounter (Signed)
Yes, pt is currently taking those medications.

## 2014-02-16 ENCOUNTER — Encounter: Payer: Self-pay | Admitting: Family Medicine

## 2014-02-16 ENCOUNTER — Other Ambulatory Visit: Payer: Self-pay | Admitting: Family Medicine

## 2014-02-16 NOTE — Telephone Encounter (Signed)
Last office visit 11/15/2013.  Ok to refill? 

## 2014-02-17 MED ORDER — TRAMADOL HCL ER 100 MG PO TB24: 100.0000 mg | ORAL_TABLET | Freq: Every day | ORAL | Status: DC

## 2014-02-17 NOTE — Telephone Encounter (Signed)
Called to [facility]. 

## 2014-03-24 ENCOUNTER — Telehealth: Payer: Self-pay

## 2014-03-24 NOTE — Telephone Encounter (Signed)
Left message for [facility] return my call.  We have been waiting on Robert Mathews to get a referral to a neurosurgeon for his back thru workers comp.  Robert Mathews has been diagnosed with: Acute Back Pain, Foraminal Stenosis of Lumbar Region, DDD & Radiculopathy of Lumbar Region.         He is being seen today by a Dr. Venetia MaxonStern.  Cyclobenzaprine, Meloxicam and Tramadol are being prescribed by Dr. Ermalene SearingBedsole until Robert Mathews could be seen by a surgeon.  I see no record of us prescribing Gabapentin for Robert Mathews.

## 2014-03-24 NOTE — Telephone Encounter (Signed)
Robert Mathews with [employer] left v/m;In v/m Robert Mathews said she is aware pt was last seen on 11/15/2013 and does not have future appt. Pt has been getting monthly refills of Tramadol,Meloxicam,Cyclobenzaprine and Gabapentin.  Robert Mathews wants cb to explain why meds being dispensed monthly without pt being seen.

## 2014-03-24 NOTE — Telephone Encounter (Signed)
We are unable to communicate about pt care with this person without pt consent. Call pt if pain not improving and not followed by specialist make appt to be re-evaluated.

## 2014-03-29 ENCOUNTER — Encounter: Payer: Self-pay | Admitting: Family Medicine

## 2014-03-29 ENCOUNTER — Other Ambulatory Visit: Payer: Self-pay | Admitting: Neurosurgery

## 2014-03-29 DIAGNOSIS — M5416 Radiculopathy, lumbar region: Secondary | ICD-10-CM

## 2014-03-30 NOTE — Telephone Encounter (Signed)
Please look into this and cancel if need be.

## 2014-04-26 ENCOUNTER — Other Ambulatory Visit: Payer: Self-pay

## 2014-05-02 ENCOUNTER — Ambulatory Visit
Admission: RE | Admit: 2014-05-02 | Discharge: 2014-05-02 | Disposition: A | Discharge status: Home / Self Care | Payer: Worker's Compensation | Source: Ambulatory Visit | Attending: Neurosurgery | Admitting: Neurosurgery

## 2014-05-02 DIAGNOSIS — M5416 Radiculopathy, lumbar region: Secondary | ICD-10-CM

## 2014-07-10 ENCOUNTER — Other Ambulatory Visit: Payer: Self-pay | Admitting: Family Medicine

## 2014-07-10 NOTE — Telephone Encounter (Signed)
Last office visit 11/15/2013.  Ok to refill? 

## 2014-11-04 ENCOUNTER — Other Ambulatory Visit: Payer: Self-pay | Admitting: Family Medicine

## 2014-11-04 NOTE — Telephone Encounter (Signed)
Last office visit 11/15/2013.  Last refilled 07/11/2014 for #30 with 2 refills.  Ok to refill?

## 2015-04-29 IMAGING — CT CT L SPINE W/O CM
4 of 9 series · 9 of 20 positions shown, 10 images · non-contrast
Comparison: MR 10/22/2013

CLINICAL DATA: Lifting injury.  Post discogram.

EXAM:
CT LUMBAR SPINE WITHOUT CONTRAST
TECHNIQUE: Multidetector CT imaging of the lumbar spine was performed without
intravenous contrast administration. Multiplanar CT image
reconstructions were also generated.

[Series 2: l spine bone · axial · 0.27mm/px · z∈[-314,-252]mm · 2 of 75 slices shown]
[im 25/75  bone]
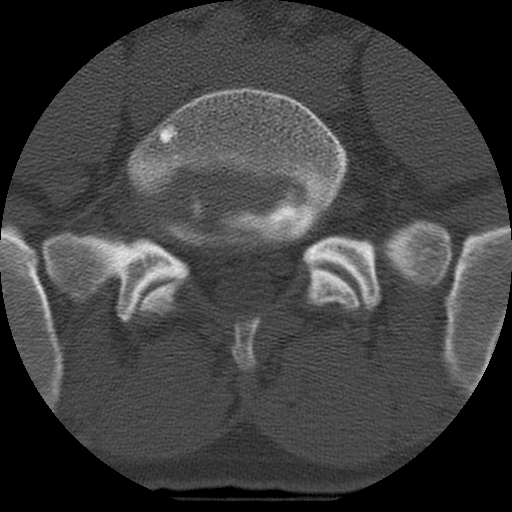
[im 50/75  bone]
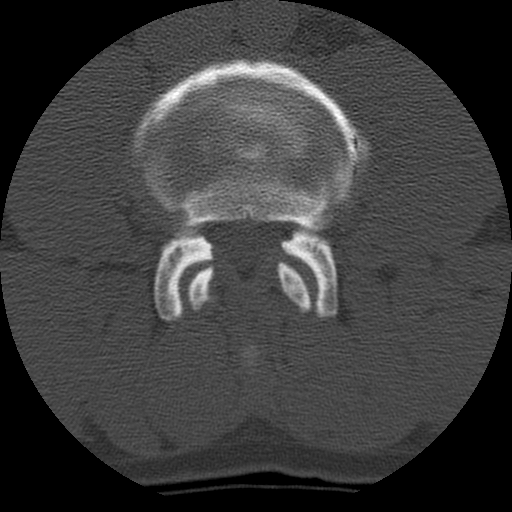

[Series 3: l spine soft · axial · 0.27mm/px · z∈[-314,-252]mm · 2 of 75 slices shown]
[im 25/75  soft-tissue]
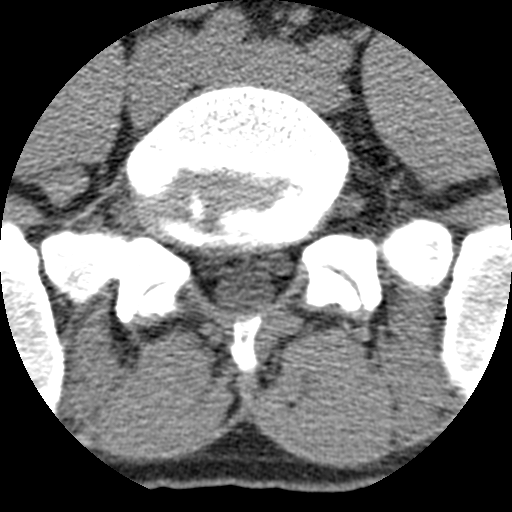
[im 50/75  soft-tissue]
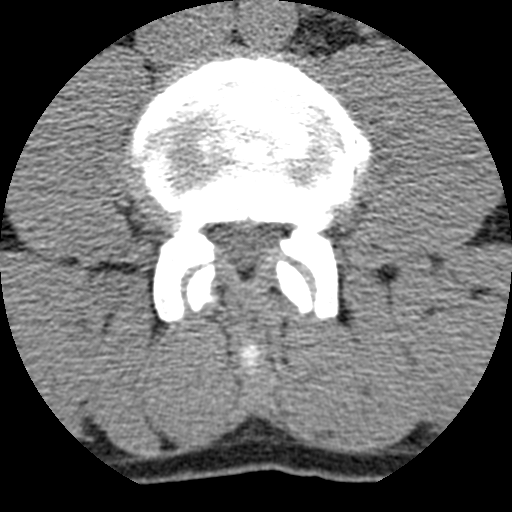

[Series 106: axial · axial · 0.25mm/px · z∈[-408,-187]mm · 3 of 51 slices shown, 4 images]
[im 1/51  soft-tissue]
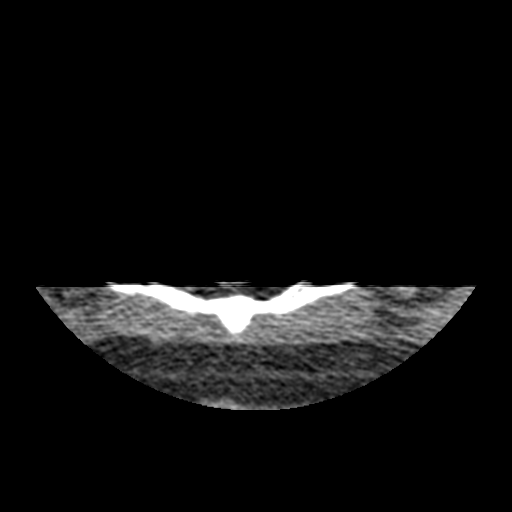
[im 1/51  bone]
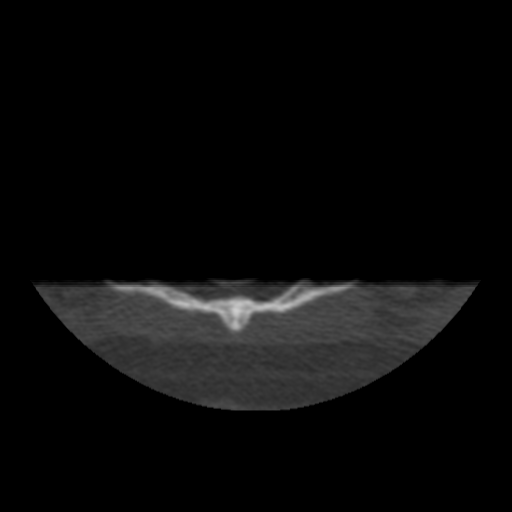
[im 26/51  bone]
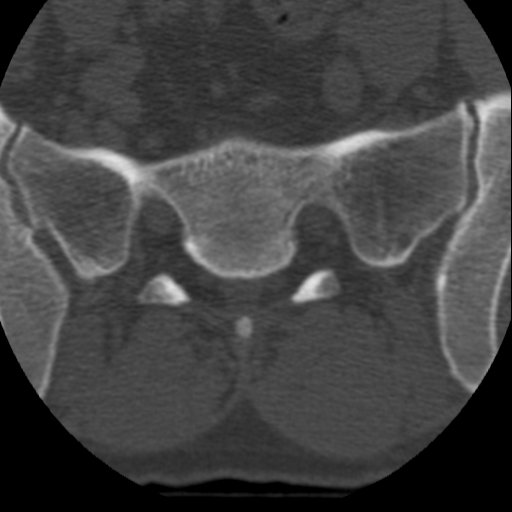
[im 51/51  bone]
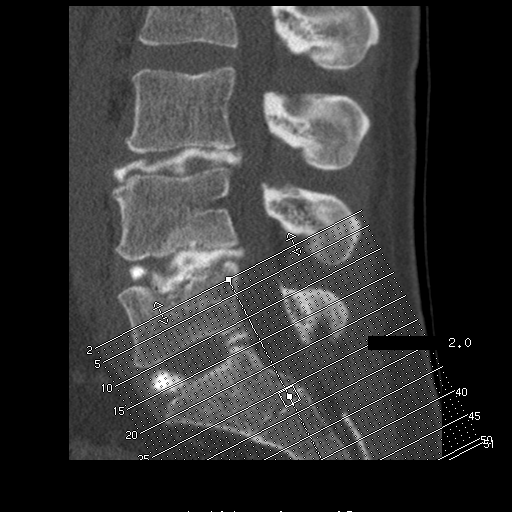

[Series 401: cor lower · coronal · 0.37mm/px · 2 of 61 slices shown]
[im 2/61  bone]
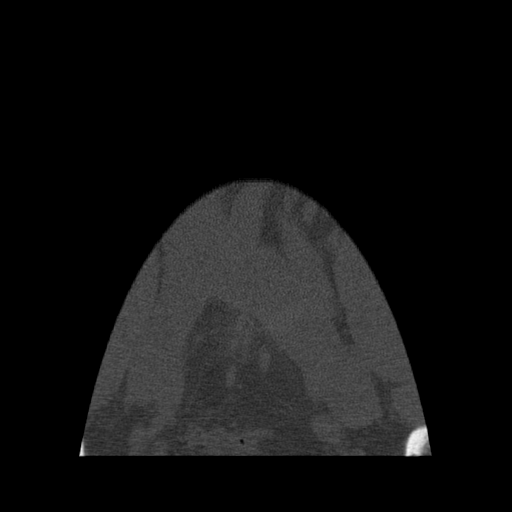
[im 60/61  bone]
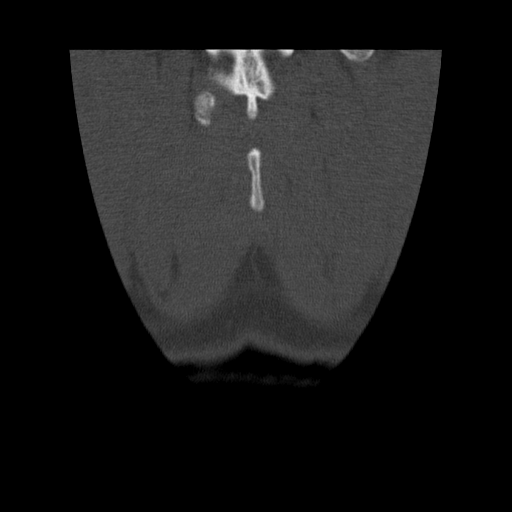

[9 of 20 positions shown; findings below may reference images not displayed]

FINDINGS: Numbering scheme follows that used on prior study.

L2-3: No significant disc bulge or protrusion. Bilateral facet DJD
right greater than left. Central canal and foramina patent.

L3-4: Moderate circumferential disc bulge. Diffuse annular
degeneration with extensive contrast extension into the annulus
circumferentially, sparing only the right posterolateral region.
There is mild bilateral facet DJD contributing to the mild spinal
stenosis and bilateral subarticular recess narrowing

L4-5: Extensive annular degeneration. Circumferential disc bulge
with extensive contrast extension into the anulus posteriorly.
Prominent Schmorl's node in the superior endplate of L5. No definite
spinal or foraminal stenosis.

L5-S1: There is some dense contrast in the annulus anteriorly on the
left, with a paucity of nuclear contrast, suggesting suboptimal
needle placement for discography. There is a mild circumferential
disc bulge with small right posterolateral protrusion. There is
annular contrast throughout most of the segment. No spinal stenosis.
Mild bilateral foraminal encroachment.
IMPRESSION: 1. Disc bulge L3-4 with diffuse annular degeneration.
2. Disc bulge L4-5 with extensive annular degeneration.
3. Suboptimal contrast distribution at L5-S1 with disc bulge and
small right posterolateral protrusion.

## 2015-09-19 HISTORY — PX: CARPAL TUNNEL RELEASE: SHX101

## 2015-10-18 ENCOUNTER — Other Ambulatory Visit: Payer: Self-pay | Admitting: Neurosurgery

## 2015-10-21 NOTE — H&P (Signed)
Patient ID:   [id number] Patient: Robert Mathews  Date of Birth: 08/09/1976 Visit Type: Office Visit   Date: 09/29/2015 10:30 AM Provider: Nathanial Millman. Bartko MD   This 39 year old male presents for pain.  History of Present Illness: 1.  pain  Patient was seen in follow-up today with chief complaint of chronic low back pain.  My assistant Caryl Pina attended throughout as a chaperone.  As review, he's had severe, disabling chronic low back pain that has been refractory to extensive conservative measures including therapy, injections and multiple adjuvants.  He has been working with Dr. Thayer Ohm regarding his depression and PTSD and has made substantial gains to the extent that he was determined to be an appropriate candidate for spinal cord stimulator trial from a psychologic standpoint.  We did undertake his trial on 09/26/15 with 2 paramedian leads at [facility].  He indicates that he's had no issues whatsoever with the trial.  Denies any fevers, chills or any other new symptoms such as any new numbness or weakness.  According to him, he's had a tremendous response.  He indicates that when utilizing his pain is down to about a one which she feels is overall about 75% improvement of both back and leg pain.  He is very pleased with his response and wishes to proceed with a permanent placement which was discussed with him today as well.  He had some questions about that there were answered.  As far as adjuvants, he continues with the Lidoderm.  He does not need any refills.  As far as his depression and PTSD, continues to work with Dr. Thayer Ohm and overall doing significant only better.  Dr. Ileene Patrick notes indicate that he could benefit from going up on his Zoloft to 200 mg daily.  We discussed this.  He is on 150 mg and seems to be tolerating it well without any side effects.  Upon review of his medications, he is on nothing else that would predispose him to a serotonin syndrome on a higher dose.  I think that's reasonable.   Prescription was rendered.      Medical/Surgical/Interim History Reviewed, no change.  Last detailed document date:04/04/2015.   PAST MEDICAL HISTORY, SURGICAL HISTORY, FAMILY HISTORY, SOCIAL HISTORY AND REVIEW OF SYSTEMS I have reviewed the patient's past medical, surgical, family and social history as well as the comprehensive review of systems as included on the [facility] history form dated 09/29/2015, which I have signed.  Family History: Reviewed, no changes.  Last detailed document: 04/04/2015.   Social History: Tobacco use reviewed. Reviewed, no changes. Last detailed document date: 04/04/2015.      MEDICATIONS(added, continued or stopped this visit): Started Medication Directions Instruction Stopped   cyclobenzaprine 10 mg tablet take 1 tablet by oral route  every day     hydroxyzine HCl 25 mg tablet take 1 tablet by oral route 4 times every day     levothyroxine 150 mcg tablet take 1 tablet by oral route  every day    04/04/2015 Lidoderm 5 % adhesive patch apply 1 patch by transdermal route 3 times every day (May wear up to 12hours.)    01/18/2015 meloxicam 15 mg tablet take 1 tablet by oral route  every day     sertraline 50 mg tablet take 1 tablet by oral route  every day    01/18/2015 tramadol ER 100 mg tablet,extended release 24 hr take 1 tablet by oral route 2 times every day  09/29/2015 Zoloft 100 mg tablet take 2 tablets by oral route  every day       ALLERGIES: Ingredient Reaction Medication Name Comment  TOPIRAMATE Shortness Of Breath       REVIEW OF SYSTEMS System Neg/Pos Details  Constitutional Negative Chills, fever, night sweats and weight loss.  GU Negative Urinary incontinence and urinary retention.  Neuro Negative Extremity weakness, numbness in extremity and no new/incr. numbness/wkness except noted in hopi.  All other review of systems are negative.    Vitals Date Temp F BP Pulse Ht In Wt Lb BMI BSA Pain  Score  09/29/2015  143/90 71 74 234 30.04  1/10     PHYSICAL EXAM General Level of Distress: no acute distress Overall Appearance: normal    Cardiovascular  Right Left  Peripheral Pulses: normal (radial/pedal); no edema normal(radial/pedal); no edema   Neurological Orientation: normal Recent and Remote Memory: normal Attention Span and Concentration:   normal Language: normal Fund of Knowledge: normal; insight/judgement nl  Right Left Sensation: normal normal Upper Extremity Coordination: normal normal  Lower Extremity Coordination: normal normal  Musculoskeletal Gait and Station: normal  Right Left Lower Extremity Muscle Strength: normal normal Lower Extremity Muscle Tone: normal normal  Motor Strength Lower extremity motor strength was tested in the clinically pertinent muscles.     Deep Tendon Reflexes  Right Left Patellar: normal normal Achilles: normal normal  Sensory Sensation was tested at L1 to S1.   Motor and other Tests    Right Left Clonus: absent absent SLR: negative negative SI Joint: nontender nontender   Additional Findings:  Skin Inspection/palpation:  Warm to touch.  No rashes, lesions or subcutaneous nodules. HENT: Hearing intact to voice.  Inspection of the lips, gums and teeth unremarkable.  External inspection of the ears and nose unremarkable. Eyes: Conjunctiva/lids inspected- symmetric without ptosis, injection.  PERRLA/EOMI.  MSK: Inspection/palpation digits and nails: Without clubbing, cyanosis, pitting.            Spine/Pevis:  Without instability, laxity or luxation.  Without asymmetry or misalignment, masses or defects.  Paraspinals with grossly normal strength without obvious atrophy. Essentially normal to inspection and palpation with no vertebral body spinous process gapping or tenderness except for < some tenderness in the lower lumbar paraspinals.>.  ROM reveals < unchanged>.    IMPRESSION 1.)  Chronic low back pain  with exceedingly successful spinal cord stimulator trial as noted above.  He would like to have permanent placement and we will refer him back to Dr. Vertell Limber for evaluation for that.  All his questions were answered.  In the meantime he will continue his Lidoderm..  2.)  <PTSD and depression.  Overall significantly improved.  He will continue psychotherapy and we will titrate his Zoloft as noted above.  Case manager was brought in with his approval and the above was reviewed. >.  3.)  < >.  4.)  < >.  5.)  < >.    6.)  < >.  Completed Orders (this encounter) Order Details Reason Side Interpretation Result Initial Treatment Date Region  Lifestyle education regarding diet Patient is encouraged to eat a well balanced diet.        Hypertension education Continue to monitor blood pressure. If Blood pressure remains elevated, follow up with Primary care.         Assessment/Plan # Detail Type Description   1. Assessment Spondylosis of lumbar region without myelopathy or radiculopathy (M47.816).       2. Assessment Depression,  unspecified depression type (F32.9).       3. Assessment PTSD (post-traumatic stress disorder) (F43.10).       4. Assessment Back pain associated with peripheral numbness (M54.9).   Plan Orders Referral to Erline Levine MD for Surgery       5. Assessment Body mass index (BMI) 30.0-30.9, adult (Z68.30).   Plan Orders Today's instructions / counseling include(s) Lifestyle education regarding diet.       6. Assessment Elevated blood-pressure reading, w/o diagnosis of htn (R03.0).         Pain Assessment/Treatment Pain Scale: 1/10. Method: Numeric Pain Intensity Scale. Location: back. Onset: 08/23/2013. Duration: varies. Quality: discomforting.  Fall Risk Plan The patient has not fallen in the last year.   Orders: Office Procedures/Services: Assessment Service Comments   Change Zoloft to 100 mg, 2 tablets by mouth daily, #60 with 3 refills     Instruction(s)/Education: Assessment Instruction  R03.0 Hypertension education  Z68.30 Lifestyle education regarding diet    MEDICATIONS PRESCRIBED TODAY    Rx Quantity Refills  ZOLOFT 100 mg  60 3            Provider:  Nathanial Millman. Bartko MD  09/29/2015 11:48 AM Dictation edited by: Nathanial Millman. Brien Few III    CC Providers: [place], [place] [id number]-              Electronically signed by Nathanial Millman. Bartko MD on 09/29/2015 11:49 AM  > [place], [place] [address] Phone: [phone]   Patient ID:   [id number] Patient: Robert Mathews  Date of Birth: 12/29/1974 Visit Type: Office Visit   Date: 02/21/2014 09:30 AM Provider: Marchia Meiers. Vertell Limber MD   This 39 year old male presents for back pain.  History of Present Illness: 1.  back pain  Robert Mathews, [phone]. male, visits reporting lumbar pain with new RLE tingling and numbness.  Pt reports long hx lumbar pain since [employer].  Some increased pain after thrown from treadmill 6yr ago, significant increase in lumbar pain after squats in gym Oct 2014. RLE numbness, tingling with PT.   Tramadol ER 1061mqAM Flexeril 106m'@HS'  Mobic 20m62mM  Hx: PTSD, Hypothyroid  MRI, X-ray on [facility]  Patient notes that he has low back pain and right leg pain but that this is increased since October 2014.  He has done physical therapy including iontophoresis and says that his degree of pain has interfered with his ability to function and that he is quite frustrated with this.  He normally uses exercise as an outlet and has not been able to do so because of his back pain.  He says he had to go on Zoloft with increase in his PTSD symptoms with decreased activity.  He says the pain varies and it is 2 out of 10 on tramadol in 4-6 out of 10 with bending to 8 out of 10 without tramadol.  He says that physical therapy made the "neurological stuff get worse" he says that sitting  for greater than 30 minutes causes back spasms.  He describes a long history of low level low back pain but that this is much worse since his recent injury while performing squats in the gym, which was required by his employer.  Imaging studies demonstrate degenerative changes at the L4 L5 level with disc degeneration and Modic endplate changes at this level.  He has 5 well aligned lumbar vertebrae on plain radiographs with degenerative  changes at the L4 L5 level without spondylolisthesis.        PAST MEDICAL/SURGICAL HISTORY   (Detailed)  Disease/disorder Onset Date Management Date Comments  Thyroid disease          PAST MEDICAL HISTORY, SURGICAL HISTORY, FAMILY HISTORY, SOCIAL HISTORY AND REVIEW OF SYSTEMS I have reviewed the patient's past medical, surgical, family and social history as well as the comprehensive review of systems as included on the [facility] history form dated 02/21/2014, which I have signed.  Family History  (Detailed)  Relationship Family Member Name Deceased Age at Death Condition Onset Age Cause of Death      Family history of Hypertension  N   SOCIAL HISTORY  (Detailed) Tobacco use reviewed. Preferred language is Unknown.   Smoking status: Never smoker.  SMOKING STATUS Use Status Type Smoking Status Usage Per Day Years Used Total Pack Years  no/never  Never smoker             MEDICATIONS(added, continued or stopped this visit):   Started Medication Directions Instruction Stopped   cyclobenzaprine 10 mg tablet take 1 tablet by oral route  every day     levothyroxine 150 mcg tablet take 1 tablet by oral route  every day     meloxicam 15 mg tablet take 1 tablet by oral route  every day    02/21/2014 Neurontin 100 mg capsule take 1 capsule by oral route 3 times every day x3 days, then 2 TIDx3 days, then 3 TID     sertraline 50 mg tablet take 1 tablet by oral route  every day    02/21/2014 tizanidine 2 mg tablet 1-2  po q 6hrs prn spasm     tramadol ER 100 mg tablet,extended release 24 hr take 1 tablet by oral route  every day      ALLERGIES:  Ingredient Reaction Medication Name Comment  NO KNOWN ALLERGIES     No known allergies.  REVIEW OF SYSTEMS System Neg/Pos Details  Constitutional Negative Chills, fatigue, fever, malaise, night sweats, weight gain and weight loss.  ENMT Negative Ear drainage, hearing loss, nasal drainage, otalgia, sinus pressure and sore throat.  Eyes Negative Eye discharge, eye pain and vision changes.  Respiratory Negative Chronic cough, cough, dyspnea, known TB exposure and wheezing.  Cardio Negative Chest pain, claudication, edema and irregular heartbeat/palpitations.  GI Negative Abdominal pain, blood in stool, change in stool pattern, constipation, decreased appetite, diarrhea, heartburn, nausea and vomiting.  GU Negative Dribbling, dysuria, erectile dysfunction, hematuria, polyuria, slow stream, urinary frequency, urinary incontinence and urinary retention.  Endocrine Negative Cold intolerance, heat intolerance, polydipsia and polyphagia.  Neuro Positive Numbness in extremity.  Psych Negative Anxiety, depression and insomnia.  Integumentary Negative Brittle hair, brittle nails, change in shape/size of mole(s), hair loss, hirsutism, hives, pruritus, rash and skin lesion.  MS Positive Back pain.  Hema/Lymph Negative Easy bleeding, easy bruising and lymphadenopathy.  Allergic/Immuno Negative Contact allergy, environmental allergies, food allergies and seasonal allergies.  Reproductive Negative Penile discharge and sexual dysfunction.    Vitals Date Temp F BP Pulse Ht In Wt Lb BMI BSA Pain Score  02/21/2014  145/88 80 74 225 28.89  2/10     PHYSICAL EXAM General Level of Distress: no acute distress Overall Appearance: normal  Head and Face  Right Left  Fundoscopic Exam:  normal normal    Cardiovascular Cardiac: regular rate and rhythm without  murmur  Respiratory Lungs: clear to auscultation  Neurological Recent and  Remote Memory: normal Attention Span and Concentration:   normal Language: normal Fund of Knowledge: normal  Right Left Sensation: normal normal Upper Extremity Coordination: normal normal  Lower Extremity Coordination: normal normal  Musculoskeletal Gait and Station: normal  Right Left Upper Extremity Muscle Strength: normal normal Lower Extremity Muscle Strength: normal normal Upper Extremity Muscle Tone:  normal normal Lower Extremity Muscle Tone: normal normal  Motor Strength Upper and lower extremity motor strength was tested in the clinically pertinent muscles.     Deep Tendon Reflexes  Right Left Biceps: normal normal Triceps: normal normal Brachiloradialis: normal normal Patellar: normal normal Achilles: normal normal  Sensory Sensation was tested at L1 to S1. Any abnormal findings will be noted below.  Right Left L4: decreased   L5: decreased    Cranial Nerves II. Optic Nerve/Visual Fields: normal III. Oculomotor: normal IV. Trochlear: normal V. Trigeminal: normal VI. Abducens: normal VII. Facial: normal VIII. Acoustic/Vestibular: normal IX. Glossopharyngeal: normal X. Vagus: normal XI. Spinal Accessory: normal XII. Hypoglossal: normal  Motor and other Tests Lhermittes: negative Rhomberg: negative    Right Left Hoffman's: normal normal Clonus: normal normal Babinski: normal normal SLR: negative negative Patrick's Corky Sox): negative negative Toe Walk: normal normal Toe Lift: normal normal Heel Walk: normal normal SI Joint: nontender nontender   Additional Findings:  Patient has midline low back pain.  He is limited in forward bending to the level of his knees.  He is able to stand on his heels and toes.  He has positive straight leg raise on the right for low back pain only.    DIAGNOSTIC RESULTS Diagnostic report text  CLINICAL DATA: Low back and bilateral  lower extremity pain for 1 month.  EXAM: MRI LUMBAR SPINE WITHOUT CONTRAST  TECHNIQUE: Multiplanar, multisequence MR imaging was performed. No intravenous contrast was administered.  COMPARISON: Lumbar spine radiographs 09/14/2013  FINDINGS: Normal signal is present in the conus medullaris which terminates at L1, within normal limits. Chronic endplate marrow changes are present at L3-4 into greater extent at L4-5. There is some loss of endplate height at E3-1 is well.  Limited imaging of the abdomen is unremarkable.  The disc levels at L2-3 and above are normal.  L3-4: A broad-based disc herniation is present. Moderate facet hypertrophy and short pedicles are present. This results in mild to moderate lateral recess narrowing, left greater than right. Mild to moderate foraminal narrowing is present bilaterally as well.  L4-5: A broad-based disc herniation is present. Mild facet hypertrophy and short pedicles are noted. This results in mild central canal stenosis. Mild to moderate foraminal stenosis is worse on the right.  L5-S1: A broad-based disc herniation is present. Moderate facet hypertrophy is evident. The central canal is patent. Moderate foraminal stenosis is evident bilaterally.  IMPRESSION: 1. Mild to moderate lateral recess and foraminal narrowing bilaterally at L3-4. 2. Mild central canal stenosis at L4-5 with mild to moderate foraminal stenosis bilaterally, worse on the right. 3. Moderate foraminal stenosis bilaterally at L5-S1 without significant central canal stenosis. 4. Chronic endplate changes and degenerative loss of endplate height at V4-0.   Electronically Signed By: Lawrence Santiago M.D. On: 10/22/2013 10:57    IMPRESSION Patient has degenerative changes at the L4 L5 level.  He has chronic low back pain and decreased pin sensation in right L5 distribution without frank weakness.  Suggested injection therapy to see if this will give him some  relief.  The patient has been out of work since October 2014.  I will start  him on low-dose Neurontin.  I have also given him a prescription for tizanidine.  I met with the patient and his nurse case manager.  Completed Orders (this encounter) Order Details Reason Side Interpretation Result Initial Treatment Date Region  Lifestyle education regarding diet Encouraged to eat a well balanced diet and follow up with primary care physician.        Hypertension education Continue to monitor blood pressure. If remains elevated, contact primary care physician.         Assessment/Plan # Detail Type Description   1. Assessment BMI 28.0-28.9,ADULT (V85.24).   Plan Orders Today's instructions / counseling include(s) Lifestyle education regarding diet.       2. Assessment Abnormal findings, elevated BP w/o HTN (796.2).       3. Assessment Lumbar spondylosis (721.3).       4. Assessment Lumbar disc degenerative disease (722.52).       5. Assessment Lumbar radiculopathy (724.4).       6. Assessment Muscle spasm of back (724.8).         Pain Assessment/Treatment Pain Scale: 2/10. Method: Numeric Pain Intensity Scale. Location: back. Onset: 08/23/2013. Duration: varies. Quality: aching, sharp, stabbing. Pain Assessment/Treatment follow-up plan of care: Patient currently taking Tramadol for pain..  We will go ahead with injection and then follow up with me after that.  If he does not get relief he may require fusion at the L4 L5 level but I advised him not to pursue this unless he is out of other options.  He is looking to modify his work situation so he is not having to do heavy lifting including wearing weighted vests  Orders: Diagnostic Procedures: Assessment Procedure  721.3 ESI Dr. Maryjean Ka  721.3 Return to Clinic  Instruction(s)/Education: Assessment Instruction  796.2 Hypertension education  V85.24 Lifestyle education regarding diet    MEDICATIONS PRESCRIBED TODAY    Rx Quantity  Refills  NEURONTIN 100 mg  100 0  TIZANIDINE HCL 2 mg  60 0            Provider:  Marchia Meiers. Vertell Limber MD  02/27/2014 07:04 PM Dictation edited by: Marchia Meiers. Vertell Limber    CC Providers: [address] [place], [place] [id number]-  ----------------------------------------------------------------------------------------------------------------------------------------------------------------------         Electronically signed by Marchia Meiers Vertell Limber MD on 02/27/2014 07:05 PM

## 2015-10-25 ENCOUNTER — Encounter (HOSPITAL_COMMUNITY)
Admission: RE | Admit: 2015-10-25 | Discharge: 2015-10-25 | Disposition: A | Discharge status: Home / Self Care | Payer: Worker's Compensation | Source: Ambulatory Visit | Attending: Neurosurgery | Admitting: Neurosurgery

## 2015-10-25 ENCOUNTER — Other Ambulatory Visit (HOSPITAL_COMMUNITY): Payer: Self-pay | Admitting: *Deleted

## 2015-10-25 ENCOUNTER — Encounter (HOSPITAL_COMMUNITY): Payer: Self-pay

## 2015-10-25 DIAGNOSIS — M5416 Radiculopathy, lumbar region: Secondary | ICD-10-CM | POA: Insufficient documentation

## 2015-10-25 DIAGNOSIS — Z01812 Encounter for preprocedural laboratory examination: Secondary | ICD-10-CM | POA: Insufficient documentation

## 2015-10-25 HISTORY — DX: Other chronic pain: G89.29

## 2015-10-25 HISTORY — DX: Depression, unspecified: F32.A

## 2015-10-25 HISTORY — DX: Unspecified osteoarthritis, unspecified site: M19.90

## 2015-10-25 HISTORY — DX: Hypothyroidism, unspecified: E03.9

## 2015-10-25 HISTORY — DX: Unspecified intracranial injury with loss of consciousness of unspecified duration, initial encounter: S06.9X9A

## 2015-10-25 HISTORY — DX: Polyneuropathy, unspecified: G62.9

## 2015-10-25 HISTORY — DX: Major depressive disorder, single episode, unspecified: F32.9

## 2015-10-25 HISTORY — DX: Unspecified intracranial injury with loss of consciousness status unknown, initial encounter: S06.9XAA

## 2015-10-25 HISTORY — DX: Post-traumatic stress disorder, unspecified: F43.10

## 2015-10-25 HISTORY — DX: Calculus of kidney: N20.0

## 2015-10-25 HISTORY — DX: Dorsalgia, unspecified: M54.9

## 2015-10-25 LAB — BASIC METABOLIC PANEL
Anion gap: 6 (ref 5–15)
BUN: 16 mg/dL (ref 6–20)
CHLORIDE: 104 mmol/L (ref 101–111)
CO2: 29 mmol/L (ref 22–32)
Calcium: 9.9 mg/dL (ref 8.9–10.3)
Creatinine, Ser: 1.25 mg/dL — ABNORMAL HIGH (ref 0.61–1.24)
GFR calc Af Amer: >60 mL/min (ref >60)
GFR calc non Af Amer: >60 mL/min (ref >60)
GLUCOSE: 84 mg/dL (ref 65–99)
POTASSIUM: 4.6 mmol/L (ref 3.5–5.1)
Sodium: 139 mmol/L (ref 135–145)

## 2015-10-25 LAB — SURGICAL PCR SCREEN
MRSA, PCR: NEGATIVE
STAPHYLOCOCCUS AUREUS: NEGATIVE

## 2015-10-25 LAB — CBC
HEMATOCRIT: 45.3 % (ref 39.0–52.0)
Hemoglobin: 15.4 g/dL (ref 13.0–17.0)
MCH: 33.3 pg (ref 26.0–34.0)
MCHC: 34 g/dL (ref 30.0–36.0)
MCV: 98.1 fL (ref 78.0–100.0)
Platelets: 244 10*3/uL (ref 150–400)
RBC: 4.62 MIL/uL (ref 4.22–5.81)
RDW: 12.9 % (ref 11.5–15.5)
WBC: 5 10*3/uL (ref 4.0–10.5)

## 2015-10-25 NOTE — Pre-Procedure Instructions (Signed)
Robert GalleryGuy Demetro  10/25/2015      Your procedure is scheduled on Tuesday, October 31, 2015 at 3:15 PM.   Report to [facility]  Entrance "A" Admitting Office at 1:15 PM.   Call this number if you have problems the morning of surgery: [phone]   Any questions prior to day of surgery, please call [phone] between 8 & 4 PM.   Remember:  Do not eat food or drink liquids after midnight Monday, 10/30/15  Take these medicines the morning of surgery with A SIP OF WATER: Levothyroxine (Synthroid), Sertraline (Zoloft), Tramadol  Stop NSAIDS (Meloxicam, Aleve, Ibuprofen, etc.) and Multivitamins as of today. Do not use any Aspirin products prior to surgery.   Do not wear jewelry.  Do not wear lotions, powders, or cologne.  You may wear deodorant.  Men may shave face and neck.  Do not bring valuables to the hospital.  [facility] is not responsible for any belongings or valuables.  Contacts, dentures or bridgework may not be worn into surgery.  Leave your suitcase in the car.  After surgery it may be brought to your room.  For patients admitted to the hospital, discharge time will be determined by your treatment team.  Patients discharged the day of surgery will not be allowed to drive home.   Special instructions:  [facility] - Preparing for Surgery  Before surgery, you can play an important role.  Because skin is not sterile, your skin needs to be as free of germs as possible.  You can reduce the number of germs on you skin by washing with CHG (chlorahexidine gluconate) soap before surgery.  CHG is an antiseptic cleaner which kills germs and bonds with the skin to continue killing germs even after washing.  Please DO NOT use if you have an allergy to CHG or antibacterial soaps.  If your skin becomes reddened/irritated stop using the CHG and inform your nurse when you arrive at Short Stay.  Do not shave (including legs and underarms) for at least 48 hours prior to the first CHG  shower.  You may shave your face.  Please follow these instructions carefully:   1.  Shower with CHG Soap the night before surgery and the                                morning of Surgery.  2.  If you choose to wash your hair, wash your hair first as usual with your       normal shampoo.  3.  After you shampoo, rinse your hair and body thoroughly to remove the                      Shampoo.  4.  Use CHG as you would any other liquid soap.  You can apply chg directly       to the skin and wash gently with scrungie or a clean washcloth.  5.  Apply the CHG Soap to your body ONLY FROM THE NECK DOWN.        Do not use on open wounds or open sores.  Avoid contact with your eyes, ears, mouth and genitals (private parts).  Wash genitals (private parts) with your normal soap.  6.  Wash thoroughly, paying special attention to the area where your surgery        will be performed.  7.  Thoroughly rinse your body  with warm water from the neck down.  8.  DO NOT shower/wash with your normal soap after using and rinsing off       the CHG Soap.  9.  Pat yourself dry with a clean towel.            10.  Wear clean pajamas.            11.  Place clean sheets on your bed the night of your first shower and do not        sleep with pets.  Day of Surgery  Do not apply any lotions the morning of surgery.  Please wear clean clothes to the hospital.   Please read over the following fact sheets that you were given. Pain Booklet, Coughing and Deep Breathing, MRSA Information and Surgical Site Infection Prevention

## 2015-10-25 NOTE — Progress Notes (Signed)
Pt denies cardiac history, chest pain or sob. 

## 2015-10-26 NOTE — H&P (Signed)
Patient ID:   [id number] Patient: Robert Mathews  Date of Birth: 08/09/1976 Visit Type: Office Visit   Date: 09/29/2015 10:30 AM Provider: Nathanial Millman. Bartko MD   This 39 year old male presents for pain.  History of Present Illness: 1.  pain  Patient was seen in follow-up today with chief complaint of chronic low back pain.  My assistant Caryl Pina attended throughout as a chaperone.  As review, he's had severe, disabling chronic low back pain that has been refractory to extensive conservative measures including therapy, injections and multiple adjuvants.  He has been working with Dr. Thayer Ohm regarding his depression and PTSD and has made substantial gains to the extent that he was determined to be an appropriate candidate for spinal cord stimulator trial from a psychologic standpoint.  We did undertake his trial on 09/26/15 with 2 paramedian leads at [facility].  He indicates that he's had no issues whatsoever with the trial.  Denies any fevers, chills or any other new symptoms such as any new numbness or weakness.  According to him, he's had a tremendous response.  He indicates that when utilizing his pain is down to about a one which she feels is overall about 75% improvement of both back and leg pain.  He is very pleased with his response and wishes to proceed with a permanent placement which was discussed with him today as well.  He had some questions about that there were answered.  As far as adjuvants, he continues with the Lidoderm.  He does not need any refills.  As far as his depression and PTSD, continues to work with Dr. Thayer Ohm and overall doing significant only better.  Dr. Ileene Patrick notes indicate that he could benefit from going up on his Zoloft to 200 mg daily.  We discussed this.  He is on 150 mg and seems to be tolerating it well without any side effects.  Upon review of his medications, he is on nothing else that would predispose him to a serotonin syndrome on a higher dose.  I think that's reasonable.   Prescription was rendered.      Medical/Surgical/Interim History Reviewed, no change.  Last detailed document date:04/04/2015.   PAST MEDICAL HISTORY, SURGICAL HISTORY, FAMILY HISTORY, SOCIAL HISTORY AND REVIEW OF SYSTEMS I have reviewed the patient's past medical, surgical, family and social history as well as the comprehensive review of systems as included on the [facility] history form dated 09/29/2015, which I have signed.  Family History: Reviewed, no changes.  Last detailed document: 04/04/2015.   Social History: Tobacco use reviewed. Reviewed, no changes. Last detailed document date: 04/04/2015.      MEDICATIONS(added, continued or stopped this visit): Started Medication Directions Instruction Stopped   cyclobenzaprine 10 mg tablet take 1 tablet by oral route  every day     hydroxyzine HCl 25 mg tablet take 1 tablet by oral route 4 times every day     levothyroxine 150 mcg tablet take 1 tablet by oral route  every day    04/04/2015 Lidoderm 5 % adhesive patch apply 1 patch by transdermal route 3 times every day (May wear up to 12hours.)    01/18/2015 meloxicam 15 mg tablet take 1 tablet by oral route  every day     sertraline 50 mg tablet take 1 tablet by oral route  every day    01/18/2015 tramadol ER 100 mg tablet,extended release 24 hr take 1 tablet by oral route 2 times every day  09/29/2015 Zoloft 100 mg tablet take 2 tablets by oral route  every day       ALLERGIES: Ingredient Reaction Medication Name Comment  TOPIRAMATE Shortness Of Breath       REVIEW OF SYSTEMS System Neg/Pos Details  Constitutional Negative Chills, fever, night sweats and weight loss.  GU Negative Urinary incontinence and urinary retention.  Neuro Negative Extremity weakness, numbness in extremity and no new/incr. numbness/wkness except noted in hopi.  All other review of systems are negative.    Vitals Date Temp F BP Pulse Ht In Wt Lb BMI BSA Pain  Score  09/29/2015  143/90 71 74 234 30.04  1/10     PHYSICAL EXAM General Level of Distress: no acute distress Overall Appearance: normal    Cardiovascular  Right Left  Peripheral Pulses: normal (radial/pedal); no edema normal(radial/pedal); no edema   Neurological Orientation: normal Recent and Remote Memory: normal Attention Span and Concentration:   normal Language: normal Fund of Knowledge: normal; insight/judgement nl  Right Left Sensation: normal normal Upper Extremity Coordination: normal normal  Lower Extremity Coordination: normal normal  Musculoskeletal Gait and Station: normal  Right Left Lower Extremity Muscle Strength: normal normal Lower Extremity Muscle Tone: normal normal  Motor Strength Lower extremity motor strength was tested in the clinically pertinent muscles.     Deep Tendon Reflexes  Right Left Patellar: normal normal Achilles: normal normal  Sensory Sensation was tested at L1 to S1.   Motor and other Tests    Right Left Clonus: absent absent SLR: negative negative SI Joint: nontender nontender   Additional Findings:  Skin Inspection/palpation:  Warm to touch.  No rashes, lesions or subcutaneous nodules. HENT: Hearing intact to voice.  Inspection of the lips, gums and teeth unremarkable.  External inspection of the ears and nose unremarkable. Eyes: Conjunctiva/lids inspected- symmetric without ptosis, injection.  PERRLA/EOMI.  MSK: Inspection/palpation digits and nails: Without clubbing, cyanosis, pitting.            Spine/Pevis:  Without instability, laxity or luxation.  Without asymmetry or misalignment, masses or defects.  Paraspinals with grossly normal strength without obvious atrophy. Essentially normal to inspection and palpation with no vertebral body spinous process gapping or tenderness except for < some tenderness in the lower lumbar paraspinals.>.  ROM reveals < unchanged>.    IMPRESSION 1.)  Chronic low back pain  with exceedingly successful spinal cord stimulator trial as noted above.  He would like to have permanent placement and we will refer him back to Dr. Vertell Limber for evaluation for that.  All his questions were answered.  In the meantime he will continue his Lidoderm..  2.)  <PTSD and depression.  Overall significantly improved.  He will continue psychotherapy and we will titrate his Zoloft as noted above.  Case manager was brought in with his approval and the above was reviewed. >.  3.)  < >.  4.)  < >.  5.)  < >.    6.)  < >.  Completed Orders (this encounter) Order Details Reason Side Interpretation Result Initial Treatment Date Region  Lifestyle education regarding diet Patient is encouraged to eat a well balanced diet.        Hypertension education Continue to monitor blood pressure. If Blood pressure remains elevated, follow up with Primary care.         Assessment/Plan # Detail Type Description   1. Assessment Spondylosis of lumbar region without myelopathy or radiculopathy (M47.816).       2. Assessment Depression,  unspecified depression type (F32.9).       3. Assessment PTSD (post-traumatic stress disorder) (F43.10).       4. Assessment Back pain associated with peripheral numbness (M54.9).   Plan Orders Referral to Maeola HarmanJoseph Estle Huguley MD for Surgery       5. Assessment Body mass index (BMI) 30.0-30.9, adult (Z68.30).   Plan Orders Today's instructions / counseling include(s) Lifestyle education regarding diet.       6. Assessment Elevated blood-pressure reading, w/o diagnosis of htn (R03.0).         Pain Assessment/Treatment Pain Scale: 1/10. Method: Numeric Pain Intensity Scale. Location: back. Onset: 08/23/2013. Duration: varies. Quality: discomforting.  Fall Risk Plan The patient has not fallen in the last year.   Orders: Office Procedures/Services: Assessment Service Comments   Change Zoloft to 100 mg, 2 tablets by mouth daily, #60 with 3 refills     Instruction(s)/Education: Assessment Instruction  R03.0 Hypertension education  Z68.30 Lifestyle education regarding diet    MEDICATIONS PRESCRIBED TODAY    Rx Quantity Refills  ZOLOFT 100 mg  60 3            Provider:  Lillia DallasAlbert K. Bartko MD  09/29/2015 11:48 AM Dictation edited by: Lillia DallasAlbert K. Murray HodgkinsBartko III    CC Providers: Amy [facility] [address] [place], [place] [id number]-              Electronically signed by Lillia DallasAlbert K. Bartko MD on 09/29/2015 11:49 AM

## 2015-10-31 ENCOUNTER — Encounter (HOSPITAL_COMMUNITY): Payer: Self-pay | Admitting: *Deleted

## 2015-10-31 ENCOUNTER — Ambulatory Visit (HOSPITAL_COMMUNITY): Payer: Worker's Compensation | Admitting: Anesthesiology

## 2015-10-31 ENCOUNTER — Ambulatory Visit (HOSPITAL_COMMUNITY): Payer: Worker's Compensation

## 2015-10-31 ENCOUNTER — Inpatient Hospital Stay (HOSPITAL_COMMUNITY)
Admission: RE | Admit: 2015-10-31 | Discharge: 2015-11-01 | DRG: 983 | Disposition: A | Discharge status: Home / Self Care | Payer: Worker's Compensation | Source: Ambulatory Visit | Attending: Neurosurgery | Admitting: Neurosurgery

## 2015-10-31 ENCOUNTER — Encounter (HOSPITAL_COMMUNITY)
Admission: RE | Disposition: A | Discharge status: Home / Self Care | Payer: Self-pay | Source: Ambulatory Visit | Attending: Neurosurgery

## 2015-10-31 DIAGNOSIS — M545 Low back pain: Secondary | ICD-10-CM | POA: Diagnosis present

## 2015-10-31 DIAGNOSIS — Z79899 Other long term (current) drug therapy: Secondary | ICD-10-CM

## 2015-10-31 DIAGNOSIS — E039 Hypothyroidism, unspecified: Secondary | ICD-10-CM | POA: Diagnosis present

## 2015-10-31 DIAGNOSIS — F4310 Post-traumatic stress disorder, unspecified: Secondary | ICD-10-CM | POA: Diagnosis present

## 2015-10-31 DIAGNOSIS — M5116 Intervertebral disc disorders with radiculopathy, lumbar region: Secondary | ICD-10-CM | POA: Diagnosis present

## 2015-10-31 DIAGNOSIS — M4726 Other spondylosis with radiculopathy, lumbar region: Principal | ICD-10-CM | POA: Diagnosis present

## 2015-10-31 DIAGNOSIS — F329 Major depressive disorder, single episode, unspecified: Secondary | ICD-10-CM | POA: Diagnosis present

## 2015-10-31 DIAGNOSIS — Z419 Encounter for procedure for purposes other than remedying health state, unspecified: Secondary | ICD-10-CM

## 2015-10-31 DIAGNOSIS — G8929 Other chronic pain: Secondary | ICD-10-CM | POA: Diagnosis present

## 2015-10-31 HISTORY — DX: Unspecified contact dermatitis due to plants, except food: L25.5

## 2015-10-31 HISTORY — PX: SPINAL CORD STIMULATOR INSERTION: SHX5378

## 2015-10-31 SURGERY — INSERTION, SPINAL CORD STIMULATOR, LUMBAR
Anesthesia: General | Site: Spine Thoracic

## 2015-10-31 MED ORDER — CEFAZOLIN SODIUM-DEXTROSE 2-3 GM-% IV SOLR
INTRAVENOUS | Status: AC
Start: 2015-10-31 — End: 2015-10-31
  Filled 2015-10-31: qty 50

## 2015-10-31 MED ORDER — PANTOPRAZOLE SODIUM 40 MG IV SOLR: 40.0000 mg | Freq: Every day | INTRAVENOUS | Status: DC

## 2015-10-31 MED ORDER — HYDROMORPHONE HCL 1 MG/ML IJ SOLN
INTRAMUSCULAR | Status: AC
  Administered 2015-10-31: 0.5 mg via INTRAVENOUS
  Filled 2015-10-31: qty 1

## 2015-10-31 MED ORDER — ACETAMINOPHEN 325 MG PO TABS: 325.0000 mg | ORAL_TABLET | ORAL | Status: DC | PRN

## 2015-10-31 MED ORDER — PHENOL 1.4 % MT LIQD: 1.0000 | OROMUCOSAL | Status: DC | PRN

## 2015-10-31 MED ORDER — THROMBIN 5000 UNITS EX SOLR
CUTANEOUS | Status: DC | PRN
  Administered 2015-10-31 (×2): 5000 [IU] via TOPICAL

## 2015-10-31 MED ORDER — LACTATED RINGERS IV SOLN
INTRAVENOUS | Status: DC
  Administered 2015-10-31: 50 mL/h via INTRAVENOUS

## 2015-10-31 MED ORDER — FENTANYL CITRATE (PF) 100 MCG/2ML IJ SOLN
INTRAMUSCULAR | Status: DC | PRN
Start: 2015-10-31 — End: 2015-10-31
  Administered 2015-10-31: 200 ug via INTRAVENOUS
  Administered 2015-10-31: 50 ug via INTRAVENOUS

## 2015-10-31 MED ORDER — ACETAMINOPHEN 650 MG RE SUPP: 650.0000 mg | RECTAL | Status: DC | PRN

## 2015-10-31 MED ORDER — HYDROCODONE-ACETAMINOPHEN 5-325 MG PO TABS: 1.0000 | ORAL_TABLET | ORAL | Status: DC | PRN

## 2015-10-31 MED ORDER — POLYETHYLENE GLYCOL 3350 17 G PO PACK: 17.0000 g | PACK | Freq: Every day | ORAL | Status: DC | PRN

## 2015-10-31 MED ORDER — SODIUM CHLORIDE 0.9 % IJ SOLN: 3.0000 mL | Freq: Two times a day (BID) | INTRAMUSCULAR | Status: DC

## 2015-10-31 MED ORDER — PROPOFOL 10 MG/ML IV BOLUS
INTRAVENOUS | Status: DC | PRN
  Administered 2015-10-31: 200 mg via INTRAVENOUS

## 2015-10-31 MED ORDER — HYDROMORPHONE HCL 1 MG/ML IJ SOLN
0.5000 mg | INTRAMUSCULAR | Status: DC | PRN
  Administered 2015-10-31 – 2015-11-01 (×2): 1 mg via INTRAVENOUS
  Filled 2015-10-31 (×2): qty 1

## 2015-10-31 MED ORDER — ALUM & MAG HYDROXIDE-SIMETH 200-200-20 MG/5ML PO SUSP: 30.0000 mL | Freq: Four times a day (QID) | ORAL | Status: DC | PRN

## 2015-10-31 MED ORDER — ONDANSETRON HCL 4 MG/2ML IJ SOLN
INTRAMUSCULAR | Status: DC | PRN
  Administered 2015-10-31: 4 mg via INTRAVENOUS

## 2015-10-31 MED ORDER — METHOCARBAMOL 1000 MG/10ML IJ SOLN
500.0000 mg | Freq: Four times a day (QID) | INTRAMUSCULAR | Status: DC | PRN
  Filled 2015-10-31: qty 5

## 2015-10-31 MED ORDER — HEMOSTATIC AGENTS (NO CHARGE) OPTIME
TOPICAL | Status: DC | PRN
  Administered 2015-10-31: 1 via TOPICAL

## 2015-10-31 MED ORDER — ARTIFICIAL TEARS OP OINT
TOPICAL_OINTMENT | OPHTHALMIC | Status: DC | PRN
  Administered 2015-10-31: 1 via OPHTHALMIC

## 2015-10-31 MED ORDER — BUPIVACAINE HCL (PF) 0.5 % IJ SOLN
INTRAMUSCULAR | Status: DC | PRN
  Administered 2015-10-31: 8 mL

## 2015-10-31 MED ORDER — SODIUM CHLORIDE 0.9 % IJ SOLN: 3.0000 mL | INTRAMUSCULAR | Status: DC | PRN

## 2015-10-31 MED ORDER — ACETAMINOPHEN 325 MG PO TABS: 650.0000 mg | ORAL_TABLET | ORAL | Status: DC | PRN

## 2015-10-31 MED ORDER — EPHEDRINE SULFATE 50 MG/ML IJ SOLN
INTRAMUSCULAR | Status: DC | PRN
  Administered 2015-10-31: 5 mg via INTRAVENOUS
  Administered 2015-10-31: 10 mg via INTRAVENOUS

## 2015-10-31 MED ORDER — MIDAZOLAM HCL 2 MG/2ML IJ SOLN
INTRAMUSCULAR | Status: AC
  Filled 2015-10-31: qty 2

## 2015-10-31 MED ORDER — SERTRALINE HCL 50 MG PO TABS
200.0000 mg | ORAL_TABLET | Freq: Every day | ORAL | Status: DC
  Administered 2015-10-31: 200 mg via ORAL
  Filled 2015-10-31: qty 4

## 2015-10-31 MED ORDER — ROCURONIUM BROMIDE 100 MG/10ML IV SOLN
INTRAVENOUS | Status: DC | PRN
  Administered 2015-10-31: 50 mg via INTRAVENOUS

## 2015-10-31 MED ORDER — LIDOCAINE-EPINEPHRINE 1 %-1:100000 IJ SOLN
INTRAMUSCULAR | Status: DC | PRN
  Administered 2015-10-31: 8 mL

## 2015-10-31 MED ORDER — PROPOFOL 10 MG/ML IV BOLUS
INTRAVENOUS | Status: AC
  Filled 2015-10-31: qty 20

## 2015-10-31 MED ORDER — BISACODYL 10 MG RE SUPP: 10.0000 mg | Freq: Every day | RECTAL | Status: DC | PRN

## 2015-10-31 MED ORDER — CEFAZOLIN SODIUM-DEXTROSE 2-3 GM-% IV SOLR
2.0000 g | Freq: Three times a day (TID) | INTRAVENOUS | Status: AC
  Administered 2015-10-31 – 2015-11-01 (×2): 2 g via INTRAVENOUS
  Filled 2015-10-31 (×2): qty 50

## 2015-10-31 MED ORDER — METHOCARBAMOL 500 MG PO TABS
500.0000 mg | ORAL_TABLET | Freq: Four times a day (QID) | ORAL | Status: DC | PRN
  Administered 2015-10-31 – 2015-11-01 (×2): 500 mg via ORAL
  Filled 2015-10-31 (×2): qty 1

## 2015-10-31 MED ORDER — TRAMADOL HCL ER 100 MG PO TB24: 100.0000 mg | ORAL_TABLET | Freq: Every day | ORAL | Status: DC | PRN

## 2015-10-31 MED ORDER — PANTOPRAZOLE SODIUM 40 MG PO TBEC
40.0000 mg | DELAYED_RELEASE_TABLET | Freq: Every day | ORAL | Status: DC
  Administered 2015-10-31: 40 mg via ORAL
  Filled 2015-10-31: qty 1

## 2015-10-31 MED ORDER — LACTATED RINGERS IV SOLN
INTRAVENOUS | Status: DC | PRN
  Administered 2015-10-31 (×2): via INTRAVENOUS

## 2015-10-31 MED ORDER — CEFAZOLIN SODIUM-DEXTROSE 2-3 GM-% IV SOLR
2.0000 g | INTRAVENOUS | Status: AC
  Administered 2015-10-31: 2 g via INTRAVENOUS

## 2015-10-31 MED ORDER — FENTANYL CITRATE (PF) 250 MCG/5ML IJ SOLN
INTRAMUSCULAR | Status: AC
  Filled 2015-10-31: qty 5

## 2015-10-31 MED ORDER — HYDROXYZINE PAMOATE 25 MG PO CAPS: 25.0000 mg | ORAL_CAPSULE | Freq: Every evening | ORAL | Status: DC | PRN

## 2015-10-31 MED ORDER — MIDAZOLAM HCL 5 MG/5ML IJ SOLN
INTRAMUSCULAR | Status: DC | PRN
  Administered 2015-10-31: 2 mg via INTRAVENOUS

## 2015-10-31 MED ORDER — 0.9 % SODIUM CHLORIDE (POUR BTL) OPTIME
TOPICAL | Status: DC | PRN
Start: 2015-10-31 — End: 2015-10-31
  Administered 2015-10-31: 1000 mL

## 2015-10-31 MED ORDER — HYDROMORPHONE HCL 1 MG/ML IJ SOLN
0.2500 mg | INTRAMUSCULAR | Status: DC | PRN
  Administered 2015-10-31 (×4): 0.5 mg via INTRAVENOUS

## 2015-10-31 MED ORDER — LEVOTHYROXINE SODIUM 100 MCG PO TABS
150.0000 ug | ORAL_TABLET | Freq: Every day | ORAL | Status: DC
  Administered 2015-11-01: 150 ug via ORAL
  Filled 2015-10-31: qty 2

## 2015-10-31 MED ORDER — FLEET ENEMA 7-19 GM/118ML RE ENEM: 1.0000 | ENEMA | Freq: Once | RECTAL | Status: DC | PRN

## 2015-10-31 MED ORDER — OXYCODONE HCL 5 MG/5ML PO SOLN: 5.0000 mg | Freq: Once | ORAL | Status: DC | PRN

## 2015-10-31 MED ORDER — OXYCODONE HCL 5 MG PO TABS: 5.0000 mg | ORAL_TABLET | Freq: Once | ORAL | Status: DC | PRN

## 2015-10-31 MED ORDER — ONDANSETRON HCL 4 MG/2ML IJ SOLN: 4.0000 mg | INTRAMUSCULAR | Status: DC | PRN

## 2015-10-31 MED ORDER — LIDOCAINE HCL (CARDIAC) 20 MG/ML IV SOLN
INTRAVENOUS | Status: DC | PRN
  Administered 2015-10-31: 80 mg via INTRAVENOUS

## 2015-10-31 MED ORDER — DOCUSATE SODIUM 100 MG PO CAPS
100.0000 mg | ORAL_CAPSULE | Freq: Two times a day (BID) | ORAL | Status: DC
  Administered 2015-10-31: 100 mg via ORAL
  Filled 2015-10-31: qty 1

## 2015-10-31 MED ORDER — OXYCODONE-ACETAMINOPHEN 5-325 MG PO TABS
1.0000 | ORAL_TABLET | ORAL | Status: DC | PRN
  Administered 2015-11-01: 2 via ORAL
  Filled 2015-10-31: qty 2

## 2015-10-31 MED ORDER — KCL IN DEXTROSE-NACL 20-5-0.45 MEQ/L-%-% IV SOLN: INTRAVENOUS | Status: DC

## 2015-10-31 MED ORDER — ACETAMINOPHEN 160 MG/5ML PO SOLN
325.0000 mg | ORAL | Status: DC | PRN
  Filled 2015-10-31: qty 20.3

## 2015-10-31 MED ORDER — ZOLPIDEM TARTRATE 5 MG PO TABS: 5.0000 mg | ORAL_TABLET | Freq: Every evening | ORAL | Status: DC | PRN

## 2015-10-31 MED ORDER — MENTHOL 3 MG MT LOZG: 1.0000 | LOZENGE | OROMUCOSAL | Status: DC | PRN

## 2015-10-31 MED ORDER — LIDOCAINE 5 % EX PTCH
2.0000 | MEDICATED_PATCH | CUTANEOUS | Status: DC
  Filled 2015-10-31 (×2): qty 2

## 2015-10-31 SURGICAL SUPPLY — 64 items
APL SKNCLS STERI-STRIP NONHPOA (GAUZE/BANDAGES/DRESSINGS)
BENZOIN TINCTURE PRP APPL 2/3 (GAUZE/BANDAGES/DRESSINGS) IMPLANT
BLADE CLIPPER SURG (BLADE) IMPLANT
BRUSH SCRUB EZ PLAIN DRY (MISCELLANEOUS) ×3 IMPLANT
BUR MATCHSTICK NEURO 3.0 LAGG (BURR) IMPLANT
BUR PRECISION FLUTE 5.0 (BURR) ×3 IMPLANT
CLOSURE WOUND 1/2 X4 (GAUZE/BANDAGES/DRESSINGS)
DECANTER SPIKE VIAL GLASS SM (MISCELLANEOUS) ×3 IMPLANT
DERMABOND ADVANCED (GAUZE/BANDAGES/DRESSINGS) ×4
DERMABOND ADVANCED .7 DNX12 (GAUZE/BANDAGES/DRESSINGS) ×2 IMPLANT
DRAPE C-ARM 42X72 X-RAY (DRAPES) ×3 IMPLANT
DRAPE INCISE IOBAN 85X60 (DRAPES) IMPLANT
DRAPE LAPAROTOMY 100X72X124 (DRAPES) ×3 IMPLANT
DRAPE POUCH INSTRU U-SHP 10X18 (DRAPES) ×3 IMPLANT
DRAPE SURG 17X23 STRL (DRAPES) ×3 IMPLANT
DRSG OPSITE POSTOP 3X4 (GAUZE/BANDAGES/DRESSINGS) ×3 IMPLANT
DRSG OPSITE POSTOP 4X6 (GAUZE/BANDAGES/DRESSINGS) ×3 IMPLANT
ELECT REM PT RETURN 9FT ADLT (ELECTROSURGICAL) ×3
ELECTRODE REM PT RTRN 9FT ADLT (ELECTROSURGICAL) ×1 IMPLANT
ELEVATER PASSER (SPINAL CORD STIMULATOR) ×3
GAUZE SPONGE 4X4 16PLY XRAY LF (GAUZE/BANDAGES/DRESSINGS) IMPLANT
GLOVE BIO SURGEON STRL SZ8 (GLOVE) ×3 IMPLANT
GLOVE BIOGEL PI IND STRL 8 (GLOVE) ×1 IMPLANT
GLOVE BIOGEL PI IND STRL 8.5 (GLOVE) ×1 IMPLANT
GLOVE BIOGEL PI INDICATOR 8 (GLOVE) ×2
GLOVE BIOGEL PI INDICATOR 8.5 (GLOVE) ×2
GLOVE ECLIPSE 7.5 STRL STRAW (GLOVE) ×3 IMPLANT
GLOVE EXAM NITRILE LRG STRL (GLOVE) IMPLANT
GLOVE EXAM NITRILE MD LF STRL (GLOVE) IMPLANT
GLOVE EXAM NITRILE XL STR (GLOVE) IMPLANT
GLOVE EXAM NITRILE XS STR PU (GLOVE) IMPLANT
GOWN STRL REUS W/ TWL LRG LVL3 (GOWN DISPOSABLE) IMPLANT
GOWN STRL REUS W/ TWL XL LVL3 (GOWN DISPOSABLE) IMPLANT
GOWN STRL REUS W/TWL 2XL LVL3 (GOWN DISPOSABLE) IMPLANT
GOWN STRL REUS W/TWL LRG LVL3 (GOWN DISPOSABLE)
GOWN STRL REUS W/TWL XL LVL3 (GOWN DISPOSABLE)
IPG PRECISION SPECTRA (Stimulator) ×3 IMPLANT
KIT BASIN OR (CUSTOM PROCEDURE TRAY) ×3 IMPLANT
KIT CHARGING (KITS) ×2
KIT CHARGING PRECISION NEURO (KITS) ×1 IMPLANT
KIT PAT PROGRAM FREELINK (KITS) ×1 IMPLANT
KIT ROOM TURNOVER OR (KITS) ×3 IMPLANT
LEAD COVER EDGE 50CM STIM KIT (Lead) ×3 IMPLANT
NEEDLE HYPO 25X1 1.5 SAFETY (NEEDLE) ×3 IMPLANT
NS IRRIG 1000ML POUR BTL (IV SOLUTION) ×3 IMPLANT
PACK LAMINECTOMY NEURO (CUSTOM PROCEDURE TRAY) ×3 IMPLANT
PAD ARMBOARD 7.5X6 YLW CONV (MISCELLANEOUS) ×3 IMPLANT
PASSER ELEVATOR (SPINAL CORD STIMULATOR) ×1 IMPLANT
REMOTE CONTROL KIT (KITS) ×3
SPONGE LAP 4X18 X RAY DECT (DISPOSABLE) IMPLANT
SPONGE SURGIFOAM ABS GEL SZ50 (HEMOSTASIS) ×3 IMPLANT
STAPLER SKIN PROX WIDE 3.9 (STAPLE) ×3 IMPLANT
STRIP CLOSURE SKIN 1/2X4 (GAUZE/BANDAGES/DRESSINGS) IMPLANT
SUT SILK 0 TIES 10X30 (SUTURE) IMPLANT
SUT SILK 2 0 FS (SUTURE) ×9 IMPLANT
SUT SILK 2 0 TIES 10X30 (SUTURE) ×3 IMPLANT
SUT VIC AB 0 CT1 18XCR BRD8 (SUTURE) ×1 IMPLANT
SUT VIC AB 0 CT1 8-18 (SUTURE) ×3
SUT VIC AB 2-0 CP2 18 (SUTURE) ×3 IMPLANT
SUT VIC AB 3-0 SH 8-18 (SUTURE) ×3 IMPLANT
TOOL LONG TUNNEL (SPINAL CORD STIMULATOR) ×3 IMPLANT
TOWEL OR 17X24 6PK STRL BLUE (TOWEL DISPOSABLE) ×3 IMPLANT
TOWEL OR 17X26 10 PK STRL BLUE (TOWEL DISPOSABLE) ×3 IMPLANT
WATER STERILE IRR 1000ML POUR (IV SOLUTION) ×3 IMPLANT

## 2015-10-31 NOTE — Progress Notes (Signed)
Awake, alert, conversant.  Full strength both lower extremities.  Doing well.  

## 2015-10-31 NOTE — Transfer of Care (Signed)
Immediate Anesthesia Transfer of Care Note  Patient: Robert Mathews  Procedure(s) Performed: Procedure(s) with comments: SPINAL CORD STIMULATOR INSERTION (N/A) - LUMBAR SPINAL CORD STIMULATOR INSERTION  Patient Location: PACU  Anesthesia Type:General  Level of Consciousness: awake, oriented and patient cooperative  Airway & Oxygen Therapy: Patient Spontanous Breathing and Patient connected to nasal cannula oxygen  Post-op Assessment: Report given to RN, Post -op Vital signs reviewed and stable and Patient moving all extremities  Post vital signs: Reviewed and stable  Last Vitals:  Filed Vitals:   10/31/15 1139  BP: 129/86  Pulse: 57  Temp: 36.6 C  Resp: 18    Complications: No apparent anesthesia complications

## 2015-10-31 NOTE — Op Note (Signed)
10/31/2015  4:08 PM  PATIENT:  Robert Mathews  39 y.o. male  PRE-OPERATIVE DIAGNOSIS:  Chronic lumbar radiculopathy  POST-OPERATIVE DIAGNOSIS:  Chronic lumbar radiculopathy  PROCEDURE:  Procedure(s) with comments: SPINAL CORD STIMULATOR INSERTION (N/A) - LUMBAR SPINAL CORD STIMULATOR INSERTION with Thoracic laminectomy and insertion of implantable pulse generator  SURGEON:  Surgeon(s) and Role:    * Scarlette Hogston, MD - Primary  PHYSICIAN ASSISTANT:   ASSISTANTS: Poteat, RN    ANESTHESIA:   general  EBL:  Total I/O In: 1000 [I.V.:1000] Out: 25 [Blood:25]  BLOOD ADMINISTERED:none  DRAINS: none   LOCAL MEDICATIONS USED:  LIDOCAINE   SPECIMEN:  No Specimen  DISPOSITION OF SPECIMEN:  N/A  COUNTS:  YES  TOURNIQUET:  * No tourniquets in log *  DICTATION: Patient is 39-year-old man  who has chronic pain with bilateral lower extremity radiculopathies. He did well with a percutaneous trial of spinal cord stimulation and presents for laminectomy and placement of spinal cord stimulator.  Procedure: Following smooth and uncomplicated induction of general endotracheal anesthesia the patient was placed in a prone position on the Wilson frame. C-arm was used to mark the T 9 10 level. His right flank was also marked for placement of implantable pulse generator. His back was then prepped and draped in the usual sterile fashion with Betadine scrub and Duraprep. Area of planned incision was infiltrated with local lidocaine. An incision was made carried to the thoracic fascia which was incised on the right side of midline and a subperiosteal dissection was performed exposing the T9 10 interlaminar space. After confirming correct orientation with C-arm a thoracic laminectomy was performed a high-speed drill and Kerrison rongeurs exposing the spinal cord dura. A 4 x 8 paddle electrode was then inserted in the epidural space and was found to be well positioned within the midline up to the superior aspect  of T 8. Anchors were then placed in the fascia was closed. A subcutaneous pocket was created to the right of midline. The tunneler was used to create a subcutaneous tract for the electrodes and these were then passed, anchored to the implantable pulse generator and connections were torqued appropriately. Redundant electrode was circularized beneath the IPG and a strain release loop was placed in the laminectomy incision site. The wounds were irrigated and closed with 2-0 and 3-0 Vicryl stitches and the wounds were dressed with Dermabond and occlusive dressings.   Impedances were assessed and found to be correct. Final radiograph demonstrated that the lead was in the midline from covering most of T 8 and T 9 levels. Patient was taken to recovery having tolerated procedure well counts were correct at the end of the case.   PLAN OF CARE: Admit to inpatient   PATIENT DISPOSITION:  PACU - hemodynamically stable.   Delay start of Pharmacological VTE agent (>24hrs) due to surgical blood loss or risk of bleeding: yes  

## 2015-10-31 NOTE — Brief Op Note (Signed)
10/31/2015  4:08 PM  PATIENT:  Robert Mathews  39 y.o. male  PRE-OPERATIVE DIAGNOSIS:  Chronic lumbar radiculopathy  POST-OPERATIVE DIAGNOSIS:  Chronic lumbar radiculopathy  PROCEDURE:  Procedure(s) with comments: SPINAL CORD STIMULATOR INSERTION (N/A) - LUMBAR SPINAL CORD STIMULATOR INSERTION with Thoracic laminectomy and insertion of implantable pulse generator  SURGEON:  Surgeon(s) and Role:    * Maeola HarmanJoseph Raymie Trani, MD - Primary  PHYSICIAN ASSISTANT:   ASSISTANTS: Poteat, RN    ANESTHESIA:   general  EBL:  Total I/O In: 1000 [I.V.:1000] Out: 25 [Blood:25]  BLOOD ADMINISTERED:none  DRAINS: none   LOCAL MEDICATIONS USED:  LIDOCAINE   SPECIMEN:  No Specimen  DISPOSITION OF SPECIMEN:  N/A  COUNTS:  YES  TOURNIQUET:  * No tourniquets in log *  DICTATION: Patient is 39 year old man  who has chronic pain with bilateral lower extremity radiculopathies. He did well with a percutaneous trial of spinal cord stimulation and presents for laminectomy and placement of spinal cord stimulator.  Procedure: Following smooth and uncomplicated induction of general endotracheal anesthesia the patient was placed in a prone position on the Wilson frame. C-arm was used to mark the T 9 10 level. His right flank was also marked for placement of implantable pulse generator. His back was then prepped and draped in the usual sterile fashion with Betadine scrub and Duraprep. Area of planned incision was infiltrated with local lidocaine. An incision was made carried to the thoracic fascia which was incised on the right side of midline and a subperiosteal dissection was performed exposing the T9 10 interlaminar space. After confirming correct orientation with C-arm a thoracic laminectomy was performed a high-speed drill and Kerrison rongeurs exposing the spinal cord dura. A 4 x 8 paddle electrode was then inserted in the epidural space and was found to be well positioned within the midline up to the superior aspect  of T 8. Anchors were then placed in the fascia was closed. A subcutaneous pocket was created to the right of midline. The tunneler was used to create a subcutaneous tract for the electrodes and these were then passed, anchored to the implantable pulse generator and connections were torqued appropriately. Redundant electrode was circularized beneath the IPG and a strain release loop was placed in the laminectomy incision site. The wounds were irrigated and closed with 2-0 and 3-0 Vicryl stitches and the wounds were dressed with Dermabond and occlusive dressings.   Impedances were assessed and found to be correct. Final radiograph demonstrated that the lead was in the midline from covering most of T 8 and T 9 levels. Patient was taken to recovery having tolerated procedure well counts were correct at the end of the case.   PLAN OF CARE: Admit to inpatient   PATIENT DISPOSITION:  PACU - hemodynamically stable.   Delay start of Pharmacological VTE agent (>24hrs) due to surgical blood loss or risk of bleeding: yes

## 2015-10-31 NOTE — Interval H&P Note (Signed)
History and Physical Interval Note:  10/31/2015 7:30 AM  Robert Mathews  has presented today for surgery, with the diagnosis of Chronic lumbar radiculopathy  The various methods of treatment have been discussed with the patient and family. After consideration of risks, benefits and other options for treatment, the patient has consented to  Procedure(s) with comments: LUMBAR SPINAL CORD STIMULATOR INSERTION (N/A) - LUMBAR SPINAL CORD STIMULATOR INSERTION as a surgical intervention .  The patient's history has been reviewed, patient examined, no change in status, stable for surgery.  I have reviewed the patient's chart and labs.  Questions were answered to the patient's satisfaction.     Lianne Carreto D

## 2015-10-31 NOTE — Anesthesia Procedure Notes (Signed)
Procedure Name: Intubation Date/Time: 10/31/2015 2:37 PM Performed by: Carmela RimaMARTINELLI, Damiel Barthold F Pre-anesthesia Checklist: Patient being monitored, Suction available, Emergency Drugs available, Patient identified and Timeout performed Patient Re-evaluated:Patient Re-evaluated prior to inductionOxygen Delivery Method: Circle system utilized Preoxygenation: Pre-oxygenation with 100% oxygen Intubation Type: IV induction Ventilation: Mask ventilation without difficulty Laryngoscope Size: Mac and 4 Grade View: Grade I Tube type: Oral Tube size: 7.5 mm Number of attempts: 1 Placement Confirmation: positive ETCO2,  ETT inserted through vocal cords under direct vision and breath sounds checked- equal and bilateral Secured at: 23 cm Tube secured with: Tape Dental Injury: Teeth and Oropharynx as per pre-operative assessment

## 2015-10-31 NOTE — Anesthesia Preprocedure Evaluation (Addendum)
Anesthesia Evaluation  Patient identified by MRN, date of birth, ID band Patient awake    Reviewed: Allergy & Precautions, NPO status , Patient's Chart, lab work & pertinent test results  History of Anesthesia Complications Negative for: history of anesthetic complications  Airway Mallampati: I  TM Distance: >3 FB Neck ROM: Full    Dental  (+) Teeth Intact, Dental Advidsory Given   Pulmonary neg shortness of breath, neg sleep apnea, neg COPD, neg recent URI, former smoker, neg PE   breath sounds clear to auscultation       Cardiovascular negative cardio ROS   Rhythm:Regular     Neuro/Psych PSYCHIATRIC DISORDERS Depression  Neuromuscular disease    GI/Hepatic negative GI ROS, Neg liver ROS,   Endo/Other  Hypothyroidism   Renal/GU negative Renal ROS     Musculoskeletal  (+) Arthritis ,   Abdominal   Peds  Hematology   Anesthesia Other Findings   Reproductive/Obstetrics                            Anesthesia Physical Anesthesia Plan  ASA: II  Anesthesia Plan: General   Post-op Pain Management:    Induction: Intravenous  Airway Management Planned: Oral ETT  Additional Equipment: None  Intra-op Plan:   Post-operative Plan: Extubation in OR  Informed Consent: I have reviewed the patients History and Physical, chart, labs and discussed the procedure including the risks, benefits and alternatives for the proposed anesthesia with the patient or authorized representative who has indicated his/her understanding and acceptance.   Dental advisory given and Dental Advisory Given  Plan Discussed with: CRNA, Surgeon and Anesthesiologist  Anesthesia Plan Comments:        Anesthesia Quick Evaluation

## 2015-11-01 MED ORDER — METHOCARBAMOL 500 MG PO TABS: 500.0000 mg | ORAL_TABLET | Freq: Four times a day (QID) | ORAL | Status: DC | PRN

## 2015-11-01 MED ORDER — HYDROMORPHONE HCL 2 MG PO TABS: 2.0000 mg | ORAL_TABLET | ORAL | Status: DC | PRN

## 2015-11-01 MED ORDER — TRAMADOL HCL ER 100 MG PO TB24: 100.0000 mg | ORAL_TABLET | Freq: Every day | ORAL | Status: DC

## 2015-11-01 NOTE — Discharge Summary (Signed)
Physician Discharge Summary  Patient ID: Robert Mathews MRN: [id number] DOB/AGE: [birth year] 39 y.o.  Admit date: 10/31/2015 Discharge date: 11/01/2015  Admission Diagnoses: Chronic lumbar radiculopathy   Discharge Diagnoses: Chronic lumbar radiculopathy s/p SPINAL CORD STIMULATOR INSERTION (N/A) - LUMBAR SPINAL CORD STIMULATOR INSERTION with Thoracic laminectomy and insertion of implantable pulse generator  Active Problems:   Chronic pain   Discharged Condition: good  Hospital Course: Robert Mathews was admitted with dx chronic lumbar radiculopathy for placement of spinal cord stimulator.  Following uncomplicated surgery, he recovered well. He reports good coverage from SCS, noting only muscle pain at incisions.   Consults: None  Significant Diagnostic Studies: radiology: X-Ray: intra-op  Treatments: surgery: SPINAL CORD STIMULATOR INSERTION (N/A) - LUMBAR SPINAL CORD STIMULATOR INSERTION with Thoracic laminectomy and insertion of implantable pulse generator   Discharge Exam: Blood pressure 110/61, pulse 59, temperature 97.7 F (36.5 C), temperature source Oral, resp. rate 18, height 6\' 2"  (1.88 m), weight 106.799 kg (235 lb 7.2 oz), SpO2 100 %. Alert, conversant. MAEW with full strength. Incisions without erythema, swelling, or drainage. Honeycomb drsgs over Dermabond. [employer] Spinal Cord Stimulator (SCS) now "on" and providing good coverage per pt. Only notes incisional/muscle pain when mobilizing.    Disposition: Discharge to home. Pt verbalizes understanding of Robert Mathews instructions and agrees to call office to schedule 3-4wk office f/u with DrStern.      Medication List    ASK your doctor about these medications        hydrOXYzine 25 MG capsule  Commonly known as:  VISTARIL  Take 25 mg by mouth at bedtime as needed for anxiety.     levothyroxine 150 MCG tablet  Commonly known as:  SYNTHROID, LEVOTHROID  TAKE 1 TABLET BY MOUTH EVERY DAY     lidocaine 5 %  Commonly known  as:  LIDODERM  Place 2 patches onto the skin daily. Remove & Discard patch within 12 hours or as directed by Robert Mathews     meloxicam 15 MG tablet  Commonly known as:  MOBIC  TAKE 1 TABLET BY MOUTH EVERY DAY     sertraline 100 MG tablet  Commonly known as:  ZOLOFT  Take 200 mg by mouth daily.     traMADol 100 MG 24 hr tablet  Commonly known as:  ULTRAM-ER  Take 1 tablet (100 mg total) by mouth daily.           Follow-up Information    Follow up with Robert Mathews, Robert Mathews.   Specialty:  Neurosurgery   Contact information:   [address] [place] [address] [phone]       Signed: Georgiann Mathews, Robert Mathews 11/01/2015, 7:40 AM

## 2015-11-01 NOTE — Discharge Instructions (Signed)
Wound Care Leave incision open to air. You may shower. Do not scrub directly on incision.  Do not put any creams, lotions, or ointments on incision. Activity Walk each and every day, increasing distance each day. No lifting greater than 5 lbs.  Avoid bending, arching, and twisting. No driving for 2 weeks; may ride as a passenger locally.  Diet Resume your normal diet.  Return to Work Will be discussed at you follow up appointment. Call Your Doctor If Any of These Occur Redness, drainage, or swelling at the wound.  Temperature greater than 101 degrees. Severe pain not relieved by pain medication. Incision starts to come apart. Follow Up Appt Call today for appointment in 3weeks [phone] or for problems.    Spinal Cord Stimulation Trial A spinal cord stimulation trial uses a spinal cord stimulator to see if your pain can be reduced. The spinal cord simulator is a small device with wire (leads) that are placed in your back. The stimulator sends electrical pulses to the spinal cord. These electrical pulses block the nerve impulses that cause pain. If the spinal cord stimulation trial reduces your pain, a permanent spinal cord stimulator may be placed (implanted). SPINAL CORD STIMULATOR PLACEMENT The spinal cord stimulator will be put in your back. Where the spinal cord stimulator is placed depends on where your pain is located.For a trial, the entire device is not put under your skin (implanted). Only the leads that go from the stimulator to the spinal cord are implanted. HOME CARE INSTRUCTIONS  You will be given information about the spinal cord stimulator. It will include:  How and when to use spinal cord stimulator.  How to care for the incision site and bandage.  What type of activity you can and cannot do.  What records to keep. You will have to keep a log of when you use it. You will need to say if your pain is less, the same or worse when the stimulator is in use. These  records will show your caregiver whether the stimulator will work for you long-term.  In 3 to 5 days, you will need to come back to the hospital or clinic. You and your caregiver will discuss how the spinal cord stimulator worked for you and whether a permanent one should be placed.  Someone will need to drive you home after the spinal cord stimulator was placed.  Take medication as directed. Ask your caregiver if it is okay to take over-the-counter medicine. SEEK MEDICAL CARE IF:  The stimulator insertion site is red or swollen.  Blood or fluid leaks from the stimulator insertion site. SEEK IMMEDIATE MEDICAL CARE IF:   The stimulator leads come out.  The bandage that covers the stimulator leads comes off.  Your pain gets worse.  You develop numbness or weakness in your legs or you have trouble walking.  You have problems urinating or having a bowel movement.  You have a fever or persistent symptoms for more than 2-3 days.  You have a fever and your symptoms suddenly get worse.   This information is not intended to replace advice given to you by your health care provider. Make sure you discuss any questions you have with your health care provider.   Document Released: 02/19/2011 Document Revised: 10/21/2012 Document Reviewed: 02/19/2011 Elsevier Interactive Patient Education [employer]2016 Elsevier Inc.

## 2015-11-01 NOTE — Progress Notes (Signed)
Subjective: Patient reports I'm doing ok...lots of muscle pain when I walk, but the medicines help"  Objective: Vital signs in last 24 hours: Temp:  [97.7 F (36.5 C)-98.6 F (37 C)] 97.7 F (36.5 C) (12/14 0404) Pulse Rate:  [50-79] 59 (12/14 0404) Resp:  [10-41] 18 (12/14 0404) BP: (87-138)/(61-99) 110/61 mmHg (12/14 0404) SpO2:  [94 %-100 %] 100 % (12/14 0404) Weight:  [106.799 kg (235 lb 7.2 oz)] 106.799 kg (235 lb 7.2 oz) (12/13 1139)  Intake/Output from previous day: 12/13 0701 - 12/14 0700 In: 1500 [I.V.:1500] Out: 25 [Blood:25] Intake/Output this shift:    Alert, conversant. MAEW with full strength. Incisions without erythema, swelling, or drainage. Honeycomb drsgs over Dermabond. [employer] Spinal Cord Stimulator (SCS) now "on" and providing good coverage per pt.   Lab Results: No results for input(s): WBC, HGB, HCT, PLT in the last 72 hours. BMET No results for input(s): NA, K, CL, CO2, GLUCOSE, BUN, CREATININE, CALCIUM in the last 72 hours.  Studies/Results: Dg Thoracolumabar Spine  10/31/2015  CLINICAL DATA:  Spinal cord stimulator insertion. EXAM: DG C-ARM 61-120 MIN; THORACOLUMBAR SPINE - 2 VIEW COMPARISON:  None. FINDINGS: Spinal cord stimulator electrode array projects over the lower thoracic region, suspected to represent T8 and T9 levels. Correlation with plain films postoperatively is recommended. IMPRESSION: As above. Electronically Signed   By: Elsie StainJohn T Curnes M.D.   On: 10/31/2015 16:05   Dg C-arm 1-60 Min  10/31/2015  CLINICAL DATA:  Spinal cord stimulator insertion. EXAM: DG C-ARM 61-120 MIN; THORACOLUMBAR SPINE - 2 VIEW COMPARISON:  None. FINDINGS: Spinal cord stimulator electrode array projects over the lower thoracic region, suspected to represent T8 and T9 levels. Correlation with plain films postoperatively is recommended. IMPRESSION: As above. Electronically Signed   By: Elsie StainJohn T Curnes M.D.   On: 10/31/2015 16:05    Assessment/Plan: Improving    LOS: 1 day  Per DrStern, d/c IV, d/c to home. Pt verbalizes understanding of d/c instructions and agrees to call office to schedule 3-4wk office f/u with DrStern. Dr. Lennon AlstromBartco typically rx's pain meds, but pt requests post-op Dilaudid and Robaxin for home use - will request of DrStern.    Georgiann Cockeroteat, Hermelinda Diegel 11/01/2015, 7:34 AM

## 2015-11-01 NOTE — Progress Notes (Signed)
Pt and wife given D/C instructions with Rx's, verbal understanding was provided. Pt's IV was removed prior to D/C. Pt's incision is covered with Honeycomb dressing and has no sign of infection. Pt received 3-n-1 from [facility] prior to D/C. Pt D/C'd home via walking @ 1020 per MD order. Pt is stable @ D/C and has no other needs at this time. Rema FendtAshley Dajha Urquilla, RN

## 2015-11-01 NOTE — Evaluation (Signed)
Physical Therapy Evaluation and Discharge Patient Details Name: Robert Mathews MRN: [id number] DOB: 1975/12/21 Today's Date: 11/01/2015   History of Present Illness  Pt is a 39 y/o male who presents s/p lumbar spinal cord stimulator insertion with thoracic  laminectomy and insertion of implantable pulse generator on 10/30/14.   Clinical Impression  Patient evaluated by Physical Therapy with no further acute PT needs identified. All education has been completed and the patient has no further questions. At the time of PT eval pt was able to perform transfers and ambulation with modified independence. See below for any follow-up Physical Therapy or equipment needs. PT is signing off. Thank you for this referral.     Follow Up Recommendations No PT follow up    Equipment Recommendations  3in1 (PT)    Recommendations for Other Services       Precautions / Restrictions Precautions Precautions: Fall;Back Precaution Booklet Issued: Yes (comment) Precaution Comments: Pt was educated on 3/3 back precautions Restrictions Weight Bearing Restrictions: No      Mobility  Bed Mobility Overal bed mobility: Modified Independent             General bed mobility comments: VC's for log roll technique. Pt was able to complete with HOB flat and no rails to simulate home environment.   Transfers Overall transfer level: Modified independent Equipment used: None Transfers: Sit to/from Stand           General transfer comment: No assist required. No LOB noted.   Ambulation/Gait Ambulation/Gait assistance: Modified independent (Device/Increase time) Ambulation Distance (Feet): 400 Feet Assistive device: None Gait Pattern/deviations: Step-through pattern;Decreased stride length Gait velocity: Decreased Gait velocity interpretation: Below normal speed for age/gender General Gait Details: Pt with occasional reported spasms in the back. Pt was able to reach for wall to steady himself during  these times, and recover without assistance.   Stairs Stairs: Yes Stairs assistance: Modified independent (Device/Increase time) Stair Management: One rail Left;Forwards Number of Stairs: 3 General stair comments: VC's for sequencing and general safety awareness.   Wheelchair Mobility    Modified Rankin (Stroke Patients Only)       Balance Overall balance assessment: Modified Independent                                           Pertinent Vitals/Pain Pain Assessment: 0-10 Pain Score: 4  Pain Location: Incision site/back spasms Pain Descriptors / Indicators: Operative site guarding;Spasm Pain Intervention(s): Limited activity within patient's tolerance;Monitored during session;Repositioned    Home Living Family/patient expects to be discharged to:: Private residence Living Arrangements: Spouse/significant other;Children Available Help at Discharge: Family;Available 24 hours/day (For 4-5 days) Type of Home: House Home Access: Stairs to enter Entrance Stairs-Rails: Left Entrance Stairs-Number of Steps: 3 Home Layout: One level Home Equipment: Shower seat - built in (2-person shower with small corner seat)      Prior Function Level of Independence: Independent               Hand Dominance   Dominant Hand: Right    Extremity/Trunk Assessment   Upper Extremity Assessment: Defer to OT evaluation           Lower Extremity Assessment: RLE deficits/detail RLE Deficits / Details: Pt reports neuropathy on RLE.     Cervical / Trunk Assessment: Normal (s/p surgery)  Communication   Communication: No difficulties  Cognition Arousal/Alertness: Awake/alert Behavior During  Therapy: WFL for tasks assessed/performed Overall Cognitive Status: Within Functional Limits for tasks assessed                      General Comments      Exercises        Assessment/Plan    PT Assessment Patent does not need any further PT services  PT  Diagnosis Acute pain   PT Problem List    PT Treatment Interventions     PT Goals (Current goals can be found in the Care Plan section) Acute Rehab PT Goals PT Goal Formulation: All assessment and education complete, DC therapy    Frequency     Barriers to discharge        Co-evaluation               End of Session   Activity Tolerance: Patient tolerated treatment well Patient left: in chair;with call bell/phone within reach Nurse Communication: Mobility status         Time: [id number] PT Time Calculation (min) (ACUTE ONLY): 18 min   Charges:   PT Evaluation $Initial PT Evaluation Tier I: 1 Procedure     PT G CodesConni Slipper:        [name] 11/01/2015, 9:22 AM   Conni SlipperLaura Yossef Gilkison, PT, DPT Acute Rehabilitation Services Pager: [phone]

## 2015-11-02 ENCOUNTER — Encounter (HOSPITAL_COMMUNITY): Payer: Self-pay | Admitting: Neurosurgery

## 2015-11-02 NOTE — Anesthesia Postprocedure Evaluation (Signed)
Anesthesia Post Note  Patient: Robert Mathews  Procedure(s) Performed: Procedure(s) (LRB): SPINAL CORD STIMULATOR INSERTION (N/A)  Patient location during evaluation: PACU Anesthesia Type: General Level of consciousness: awake Pain management: pain level controlled Vital Signs Assessment: post-procedure vital signs reviewed and stable Respiratory status: spontaneous breathing Cardiovascular status: stable Postop Assessment: no signs of nausea or vomiting Anesthetic complications: no    Last Vitals:  Filed Vitals:   11/01/15 0404 11/01/15 0746  BP: 110/61 118/62  Pulse: 59 73  Temp: 36.5 C 37.5 C  Resp: 18 18    Last Pain:  Filed Vitals:   11/01/15 1031  PainSc: 4                  Kendarious Gudino

## 2016-10-27 IMAGING — RF DG C-ARM 61-120 MIN
1 series · 1 of 1 positions shown · non-contrast
Comparison: None.

CLINICAL DATA: Spinal cord stimulator insertion.

EXAM:
DG C-ARM 61-120 MIN; THORACOLUMBAR SPINE - 2 VIEW

[Series 1: run · 1 of 1 slices shown]
[im 1/1]
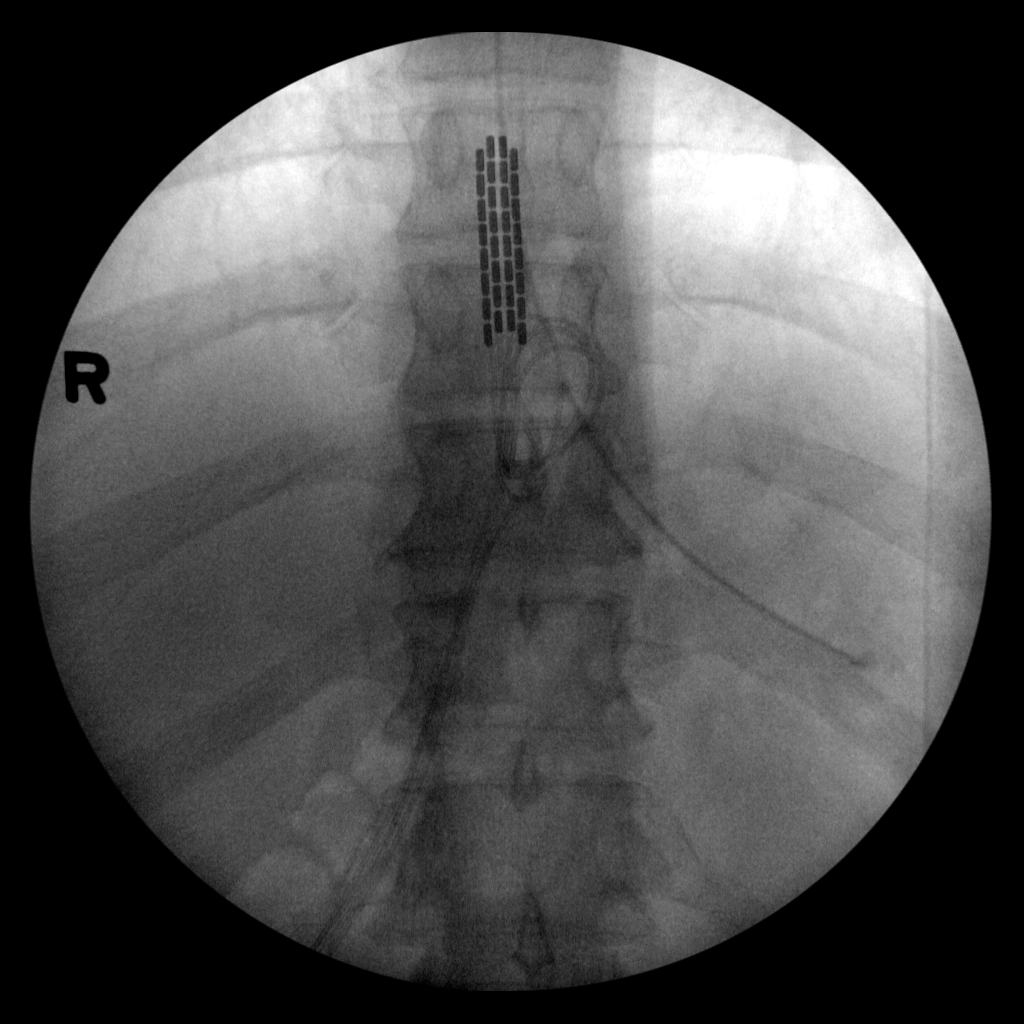

[1 of 1 positions shown; findings below may reference images not displayed]

FINDINGS: Spinal cord stimulator electrode array projects over the lower
thoracic region, suspected to represent T8 and T9 levels.
Correlation with plain films postoperatively is recommended.
IMPRESSION: As above.

## 2019-06-17 ENCOUNTER — Ambulatory Visit: Payer: Self-pay | Admitting: Family Medicine

## 2020-05-15 ENCOUNTER — Ambulatory Visit: Payer: Self-pay | Admitting: Family Medicine

## 2020-07-04 ENCOUNTER — Ambulatory Visit: Payer: Self-pay | Admitting: Family Medicine

## 2020-07-17 ENCOUNTER — Encounter: Payer: Self-pay | Admitting: Family Medicine

## 2020-07-17 ENCOUNTER — Ambulatory Visit (INDEPENDENT_AMBULATORY_CARE_PROVIDER_SITE_OTHER): Payer: 59 | Admitting: Family Medicine

## 2020-07-17 ENCOUNTER — Other Ambulatory Visit: Payer: Self-pay

## 2020-07-17 VITALS — BP 130/84 | HR 103 | Temp 97.9°F | Ht 75.2 in | Wt 251.0 lb

## 2020-07-17 DIAGNOSIS — E782 Mixed hyperlipidemia: Secondary | ICD-10-CM | POA: Diagnosis not present

## 2020-07-17 DIAGNOSIS — Z1211 Encounter for screening for malignant neoplasm of colon: Secondary | ICD-10-CM

## 2020-07-17 DIAGNOSIS — G629 Polyneuropathy, unspecified: Secondary | ICD-10-CM | POA: Insufficient documentation

## 2020-07-17 DIAGNOSIS — F4310 Post-traumatic stress disorder, unspecified: Secondary | ICD-10-CM

## 2020-07-17 DIAGNOSIS — E039 Hypothyroidism, unspecified: Secondary | ICD-10-CM | POA: Diagnosis not present

## 2020-07-17 NOTE — Assessment & Plan Note (Signed)
Will check labs

## 2020-07-17 NOTE — Patient Instructions (Addendum)
Lab results should be back by tomorrow

## 2020-07-17 NOTE — Progress Notes (Signed)
   Subjective:     Robert Mathews is a 44 y.o. male presenting for Establish Care     HPI   #Thyroid - taking medication - feels fine - no labs for >1 year - follows at [place] but planning to switch   Review of Systems   Social History   Tobacco Use  Smoking Status Former Smoker  . Years: 10.00  . Types: Cigarettes  . Quit date: 10/24/2004  . Years since quitting: 15.7  Smokeless Tobacco Former Neurosurgeon  . Types: Chew  . Quit date: 10/24/2004        Objective:    BP Readings from Last 3 Encounters:  07/17/20 130/84  11/01/15 118/62  10/25/15 122/80   Wt Readings from Last 3 Encounters:  07/17/20 251 lb (113.9 kg)  10/31/15 235 lb 7.2 oz (106.8 kg)  10/25/15 235 lb 7.2 oz (106.8 kg)    BP 130/84   Pulse (!) 103   Temp 97.9 F (36.6 C) (Temporal)   Ht 6' 3.2" (1.91 m)   Wt 251 lb (113.9 kg)   SpO2 97%   BMI 31.21 kg/m    Physical Exam Constitutional:      Appearance: Normal appearance. He is not ill-appearing or diaphoretic.  HENT:     Right Ear: External ear normal.     Left Ear: External ear normal.  Eyes:     General: No scleral icterus.    Extraocular Movements: Extraocular movements intact.     Conjunctiva/sclera: Conjunctivae normal.  Cardiovascular:     Rate and Rhythm: Normal rate and regular rhythm.     Heart sounds: No murmur heard.   Pulmonary:     Effort: Pulmonary effort is normal. No respiratory distress.     Breath sounds: Normal breath sounds. No wheezing or rales.  Musculoskeletal:     Cervical back: Neck supple.  Skin:    General: Skin is warm and dry.  Neurological:     Mental Status: He is alert. Mental status is at baseline.  Psychiatric:        Mood and Affect: Mood normal.           Assessment & Plan:   Problem List Items Addressed This Visit      Endocrine   Hypothyroidism    Reports stable. Labs today. Cont levothyroxine      Relevant Orders   TSH     Other   HYPERLIPIDEMIA - Primary    Will check labs       Relevant Orders   Comprehensive metabolic panel   Lipid panel   Post traumatic stress disorder (PTSD)    Controlled on zoloft. Cont medication       Other Visit Diagnoses    Screening for colon cancer       Relevant Orders   Fecal occult blood, imunochemical       Return in about 1 year (around 07/17/2021).  Lynnda Child, MD  This visit occurred during the SARS-CoV-2 public health emergency.  Safety protocols were in place, including screening questions prior to the visit, additional usage of staff PPE, and extensive cleaning of exam room while observing appropriate contact time as indicated for disinfecting solutions.

## 2020-07-17 NOTE — Assessment & Plan Note (Signed)
Reports stable. Labs today. Cont levothyroxine

## 2020-07-17 NOTE — Assessment & Plan Note (Signed)
Controlled on zoloft. Cont medication

## 2020-07-18 LAB — COMPREHENSIVE METABOLIC PANEL
ALT: 33 U/L (ref 0–53)
AST: 23 U/L (ref 0–37)
Albumin: 4.7 g/dL (ref 3.5–5.2)
Alkaline Phosphatase: 77 U/L (ref 39–117)
BUN: 17 mg/dL (ref 6–23)
CO2: 27 mEq/L (ref 19–32)
Calcium: 9.8 mg/dL (ref 8.4–10.5)
Chloride: 104 mEq/L (ref 96–112)
Creatinine, Ser: 1.29 mg/dL (ref 0.40–1.50)
GFR: 60.08 mL/min (ref >60.00)
Glucose, Bld: 105 mg/dL — ABNORMAL HIGH (ref 70–99)
Potassium: 3.9 mEq/L (ref 3.5–5.1)
Sodium: 139 mEq/L (ref 135–145)
Total Bilirubin: 0.6 mg/dL (ref 0.2–1.2)
Total Protein: 7.2 g/dL (ref 6.0–8.3)

## 2020-07-18 LAB — LIPID PANEL
Cholesterol: 270 mg/dL — ABNORMAL HIGH (ref 0–200)
HDL: 42.6 mg/dL (ref >39.00)
Total CHOL/HDL Ratio: 6
Triglycerides: 548 mg/dL — ABNORMAL HIGH (ref 0.0–149.0)

## 2020-07-18 LAB — LDL CHOLESTEROL, DIRECT: Direct LDL: 147 mg/dL

## 2020-07-18 LAB — TSH: TSH: 3.66 u[IU]/mL (ref 0.35–4.50)

## 2020-08-01 ENCOUNTER — Encounter: Payer: Self-pay | Admitting: *Deleted

## 2020-08-15 ENCOUNTER — Other Ambulatory Visit: Payer: Self-pay

## 2020-08-15 ENCOUNTER — Ambulatory Visit (INDEPENDENT_AMBULATORY_CARE_PROVIDER_SITE_OTHER): Payer: 59 | Admitting: Family Medicine

## 2020-08-15 ENCOUNTER — Encounter: Payer: Self-pay | Admitting: Family Medicine

## 2020-08-15 VITALS — BP 118/84 | HR 72 | Temp 97.7°F | Wt 246.2 lb

## 2020-08-15 DIAGNOSIS — Z23 Encounter for immunization: Secondary | ICD-10-CM | POA: Diagnosis not present

## 2020-08-15 DIAGNOSIS — H5789 Other specified disorders of eye and adnexa: Secondary | ICD-10-CM | POA: Insufficient documentation

## 2020-08-15 NOTE — Assessment & Plan Note (Signed)
Red painful eye without clear cause on exam. Normal fluorescin exam but persistent pain. Visual acuity decreased - but bilaterally and > 1 year since last eye exam wears glasses. Etiology not clear and given persistence of pain w/o clear source will do urgent referral to ophthalmology. Will treat with empiric abx drops if not able to be seen today.

## 2020-08-15 NOTE — Patient Instructions (Signed)
Sending you to the eye specialist

## 2020-08-15 NOTE — Progress Notes (Signed)
Subjective:     Robert Mathews is a 44 y.o. male presenting for Eye Pain (L  woke up this morning in pain )     HPI  #eye pain - started this morning - not itchy - no injury - present when he work  - pain around the orbit of the eye - pain with movement of the eye - no discharge or drainage - took ibuprofen this morning  - photophobia - no vision changes - no double vision    Review of Systems   Social History   Tobacco Use  Smoking Status Former Smoker  . Years: 10.00  . Types: Cigarettes  . Quit date: 10/24/2004  . Years since quitting: 15.8  Smokeless Tobacco Former Neurosurgeon  . Types: Chew  . Quit date: 10/24/2004        Objective:    BP Readings from Last 3 Encounters:  08/15/20 118/84  07/17/20 130/84  11/01/15 118/62   Wt Readings from Last 3 Encounters:  08/15/20 246 lb 4 oz (111.7 kg)  07/17/20 251 lb (113.9 kg)  10/31/15 235 lb 7.2 oz (106.8 kg)    BP 118/84   Pulse 72   Temp 97.7 F (36.5 C) (Temporal)   Wt 246 lb 4 oz (111.7 kg)   SpO2 97%   BMI 30.62 kg/m    Physical Exam Constitutional:      Appearance: Normal appearance. He is not ill-appearing or diaphoretic.  HENT:     Right Ear: External ear normal.     Left Ear: External ear normal.     Nose: Nose normal.  Eyes:     General: Lids are normal. No visual field deficit or scleral icterus.       Right eye: No discharge or hordeolum.        Left eye: No discharge or hordeolum.     Extraocular Movements: Extraocular movements intact.     Conjunctiva/sclera:     Right eye: Right conjunctiva is not injected.     Left eye: Left conjunctiva is injected. No exudate or hemorrhage.    Comments: Normal EOM but with pain with movement - worse with upper eye pain with downward gaze. Florescin with collection on the lower left eye but no associated with pain. Persistent pain after eye drop numbing agent.   Cardiovascular:     Rate and Rhythm: Normal rate.  Pulmonary:     Effort: Pulmonary  effort is normal.  Musculoskeletal:     Cervical back: Neck supple.  Skin:    General: Skin is warm and dry.  Neurological:     Mental Status: He is alert. Mental status is at baseline.  Psychiatric:        Mood and Affect: Mood normal.           Assessment & Plan:   Problem List Items Addressed This Visit      Other   Red eye - Primary    Red painful eye without clear cause on exam. Normal fluorescin exam but persistent pain. Visual acuity decreased - but bilaterally and > 1 year since last eye exam wears glasses. Etiology not clear and given persistence of pain w/o clear source will do urgent referral to ophthalmology. Will treat with empiric abx drops if not able to be seen today.        Relevant Orders   Ambulatory referral to Ophthalmology    Other Visit Diagnoses    Need for influenza vaccination  Relevant Orders   Flu Vaccine QUAD 36+ mos IM (Completed)       Return if symptoms worsen or fail to improve.  Lynnda Child, MD  This visit occurred during the SARS-CoV-2 public health emergency.  Safety protocols were in place, including screening questions prior to the visit, additional usage of staff PPE, and extensive cleaning of exam room while observing appropriate contact time as indicated for disinfecting solutions.

## 2020-09-11 ENCOUNTER — Other Ambulatory Visit: Payer: Self-pay

## 2020-09-11 MED ORDER — LEVOTHYROXINE SODIUM 150 MCG PO TABS: ORAL_TABLET | ORAL | 3 refills | Status: DC

## 2020-09-11 NOTE — Telephone Encounter (Signed)
Pt left v/m requesting levothyroxine 150 mcg to [facility]; pt established care on 07/17/20 and had continue levothyroxine; pt had TSH on 07/17/20. In v/m pt did not have time to go to [place] to get med. Pt request cb when refilled.

## 2020-12-20 ENCOUNTER — Encounter: Payer: Self-pay | Admitting: Family Medicine

## 2020-12-20 ENCOUNTER — Other Ambulatory Visit: Payer: Self-pay

## 2020-12-20 ENCOUNTER — Ambulatory Visit (INDEPENDENT_AMBULATORY_CARE_PROVIDER_SITE_OTHER): Payer: 59 | Admitting: Family Medicine

## 2020-12-20 DIAGNOSIS — H9202 Otalgia, left ear: Secondary | ICD-10-CM | POA: Diagnosis not present

## 2020-12-20 MED ORDER — FLUTICASONE PROPIONATE 50 MCG/ACT NA SUSP: 2.0000 | Freq: Every day | NASAL | 1 refills | Status: DC

## 2020-12-20 NOTE — Progress Notes (Signed)
Subjective:    Patient ID: Robert Mathews, male    DOB: 09/05/1976, 46 y.o.   MRN: [id number]  This visit occurred during the SARS-CoV-2 public health emergency.  Safety protocols were in place, including screening questions prior to the visit, additional usage of staff PPE, and extensive cleaning of exam room while observing appropriate contact time as indicated for disinfecting solutions.    HPI 45 yo pt of Dr Selena Batten presents with ear pain that radiates to top of the head  Ear pain started yesterday afternoon -left  Radiates to top of skull  Took ibuprofen -it does help   Dull pain is constant  Sharp when he chews or swallows Pops occasionally  No dizziness or vertigo symptoms   No fever   Has baseline tinnitus Feels a little full Was sniffly last week   Patient Active Problem List   Diagnosis Date Noted  . Left ear pain 12/20/2020  . Red eye 08/15/2020  . Neuropathy 07/17/2020  . Chronic pain 10/31/2015  . Foraminal stenosis of lumbar region 11/19/2013  . DDD (degenerative disc disease) 11/19/2013  . Radiculopathy of lumbar region 11/19/2013  . Chronic back pain 07/15/2013  . Post traumatic stress disorder (PTSD) 12/16/2011  . Hypothyroidism 03/08/2009  . HYPERLIPIDEMIA 09/22/2007  . DYSPLASTIC NEVUS, BACK 06/18/2007   Past Medical History:  Diagnosis Date  . Arthritis    right knee  . Chronic back pain   . Depression   . Dermatitis due to plant   . Hypothyroidism   . Kidney stones   . Neuropathy    both legs  . PNA (pneumonia)    2 times  . PTSD (post-traumatic stress disorder)   . Radius fracture   . TBI (traumatic brain injury) [facility])    Past Surgical History:  Procedure Laterality Date  . arm surgery     put under anesthesia for resetting of fracture  . CARPAL TUNNEL RELEASE Right 09/2015  . mole removed    . REFRACTIVE SURGERY Bilateral   . SPINAL CORD STIMULATOR INSERTION N/A 10/31/2015   Procedure: SPINAL CORD STIMULATOR INSERTION;  Surgeon: Maeola Harman, MD;  Location: [facility];  Service: Neurosurgery;  Laterality: N/A;  LUMBAR SPINAL CORD STIMULATOR INSERTION   Social History   Tobacco Use  . Smoking status: Former Smoker    Years: 10.00    Types: Cigarettes    Quit date: 10/24/2004    Years since quitting: 16.1  . Smokeless tobacco: Former Neurosurgeon    Types: Chew    Quit date: 10/24/2004  Vaping Use  . Vaping Use: Never used  Substance Use Topics  . Alcohol use: Yes    Comment: weekends, 6-8 servings per week  . Drug use: No   Family History  Problem Relation Age of Onset  . Alcohol abuse Father   . Hypertension Father   . Heart disease Father   . Emphysema Father   . Arthritis Father   . Depression Father   . Drug abuse Father   . Diabetes Mother   . Arthritis Mother   . Diabetes Maternal Grandmother   . Heart disease Maternal Grandfather        cabg  . Heart attack Maternal Grandfather 68  . Heart disease Paternal Grandfather   . Diabetes Paternal Grandfather   . Learning disabilities Sister   . Arthritis/Rheumatoid Sister    Allergies  Allergen Reactions  . Topamax [Topiramate] Other (See Comments)    irritable  Current Outpatient Medications on File Prior to Visit  Medication Sig Dispense Refill  . levothyroxine (SYNTHROID) 150 MCG tablet TAKE 1 TABLET BY MOUTH EVERY DAY 90 tablet 3  . sertraline (ZOLOFT) 100 MG tablet Take 200 mg by mouth daily.     No current facility-administered medications on file prior to visit.     Review of Systems  Constitutional: Negative for activity change, appetite change, fatigue, fever and unexpected weight change.  HENT: Positive for ear pain, postnasal drip and tinnitus. Negative for congestion, ear discharge, facial swelling, hearing loss, rhinorrhea, sore throat and trouble swallowing.   Eyes: Negative for pain, redness, itching and visual disturbance.  Respiratory: Negative for cough, chest tightness, shortness of breath and wheezing.   Cardiovascular:  Negative for chest pain and palpitations.  Gastrointestinal: Negative for abdominal pain, blood in stool, constipation, diarrhea and nausea.  Endocrine: Negative for cold intolerance, heat intolerance, polydipsia and polyuria.  Genitourinary: Negative for difficulty urinating, dysuria, frequency and urgency.  Musculoskeletal: Negative for arthralgias, joint swelling and myalgias.  Skin: Negative for pallor and rash.  Neurological: Negative for dizziness, tremors, facial asymmetry, weakness, light-headedness, numbness and headaches.  Hematological: Negative for adenopathy. Does not bruise/bleed easily.  Psychiatric/Behavioral: Negative for decreased concentration and dysphoric mood. The patient is not nervous/anxious.        Objective:   Physical Exam Constitutional:      General: He is not in acute distress.    Appearance: Normal appearance. He is obese. He is not ill-appearing.  HENT:     Head: Normocephalic and atraumatic.     Comments: No sinus tenderness No temporal tenderness No TMJ tenderness    Right Ear: Tympanic membrane, ear canal and external ear normal. There is no impacted cerumen.     Left Ear: Tympanic membrane and ear canal normal. There is no impacted cerumen.     Ears:     Comments: Slight discomfort when pressing on tragus No erythema or swelling   No effusion, erythema or bulging of TM    Nose: No congestion.     Mouth/Throat:     Mouth: Mucous membranes are moist.     Pharynx: No oropharyngeal exudate or posterior oropharyngeal erythema.  Eyes:     General:        Right eye: No discharge.        Left eye: No discharge.     Extraocular Movements: Extraocular movements intact.     Conjunctiva/sclera: Conjunctivae normal.     Pupils: Pupils are equal, round, and reactive to light.     Comments: No nystagmus  Cardiovascular:     Rate and Rhythm: Normal rate and regular rhythm.     Heart sounds: Normal heart sounds.  Pulmonary:     Effort: Pulmonary effort  is normal. No respiratory distress.     Breath sounds: Normal breath sounds. No wheezing.  Musculoskeletal:     Cervical back: Normal range of motion and neck supple. No rigidity or tenderness.  Lymphadenopathy:     Cervical: No cervical adenopathy.  Skin:    General: Skin is warm and dry.     Findings: No erythema or rash.     Comments: No rash on face or scalp   Neurological:     Mental Status: He is alert.     Cranial Nerves: No cranial nerve deficit.     Motor: No weakness.     Coordination: Coordination normal.  Psychiatric:        Mood and  Affect: Mood normal.           Assessment & Plan:   Problem List Items Addressed This Visit      Other   Left ear pain    Suspect ETD given pattern (expect that fluid/pressure comes and goes)  No rash or face/scalp tenderness No TMJ tenderness  Trigeminal neuralgia and early zoster could present similarly- will watch this  Px flonase to use daily for at least 2 weeks  inst to update if symptoms worsen/change  Also if not starting to improve in a week

## 2020-12-20 NOTE — Assessment & Plan Note (Signed)
Suspect ETD given pattern (expect that fluid/pressure comes and goes)  No rash or face/scalp tenderness No TMJ tenderness  Trigeminal neuralgia and early zoster could present similarly- will watch this  Px flonase to use daily for at least 2 weeks  inst to update if symptoms worsen/change  Also if not starting to improve in a week

## 2020-12-20 NOTE — Patient Instructions (Addendum)
Use flonase daily for eustachian tube dysfunction  Use for at least 2 weeks  Watch for severe pain, rash, dizziness, hearing change  Also fever or jaw pain   Ibuprofen is fine

## 2021-01-12 ENCOUNTER — Other Ambulatory Visit: Payer: Self-pay | Admitting: Family Medicine

## 2021-01-12 NOTE — Telephone Encounter (Signed)
Pharmacy requests refill on: Fluticasone 50 mcg/act  LAST REFILL: 12/20/2020 (Q-16 g, R-1) LAST OV: 12/20/2020 NEXT OV: Not Scheduled  PHARMACY: [id number] [place], [place]

## 2021-01-19 ENCOUNTER — Telehealth: Payer: Self-pay

## 2021-01-19 MED ORDER — SYNTHROID 150 MCG PO TABS: 150.0000 ug | ORAL_TABLET | Freq: Every day | ORAL | 1 refills | Status: DC

## 2021-01-19 NOTE — Telephone Encounter (Signed)
Spoke to pt and notified him that an RX of synthroid was sent in to his pharmacy and to please make an appt with [place] in 4-6 weeks if no improvements with symptoms. Pt stated understanding.

## 2021-01-19 NOTE — Telephone Encounter (Signed)
Ok to send in new prescription for name brand of current dose. If his symptoms do not improve over 4-6 weeks would recommend an office visit to discuss and repeat his thyroid test

## 2021-01-19 NOTE — Telephone Encounter (Signed)
Pt left v/m that he has been taking levothyroxine 150 mcg and the [facility]  Previously gave pt name brand Synthroid; pt said that he does not feel well regulated taking the generic synthroid as pt did taking the namebrand Synthroid. Pt feels foggy and has had wt gain. Pt request cb. Sending note to DR Selena Batten and Lorene Dy CMA.

## 2021-01-25 ENCOUNTER — Telehealth: Payer: Self-pay

## 2021-01-25 MED ORDER — SYNTHROID 150 MCG PO TABS: 150.0000 ug | ORAL_TABLET | Freq: Every day | ORAL | 1 refills | Status: AC

## 2021-01-25 NOTE — Telephone Encounter (Signed)
Rx has been faxed to [facility] as requested.

## 2021-01-25 NOTE — Telephone Encounter (Signed)
Pt left v/m requesting copy of synthroid rx that Dr Selena Batten wrote faxed to [facility] at [phone] atten: Dr Kathrene Alu. [facility] and pt will not have to pay out of pocket. Sending note to [facility] CMA.

## 2022-02-20 ENCOUNTER — Ambulatory Visit: Payer: 59 | Admitting: Family Medicine

## 2022-02-20 VITALS — BP 122/90 | HR 84 | Temp 98.0°F | Ht 75.25 in | Wt 245.4 lb

## 2022-02-20 DIAGNOSIS — F4310 Post-traumatic stress disorder, unspecified: Secondary | ICD-10-CM

## 2022-02-20 DIAGNOSIS — F321 Major depressive disorder, single episode, moderate: Secondary | ICD-10-CM | POA: Diagnosis not present

## 2022-02-20 DIAGNOSIS — F411 Generalized anxiety disorder: Secondary | ICD-10-CM | POA: Diagnosis not present

## 2022-02-20 MED ORDER — CITALOPRAM HYDROBROMIDE 40 MG PO TABS: 40.0000 mg | ORAL_TABLET | Freq: Every day | ORAL | 3 refills | Status: DC

## 2022-02-20 NOTE — Assessment & Plan Note (Signed)
Stop Zoloft, start citalopram and therapy. ?

## 2022-02-20 NOTE — Progress Notes (Signed)
? ?Subjective:  ? ?  ?Robert Mathews is a 46 y.o. male presenting for Form Completion (FMLA and short term disability) ?  ? ? ?HPI ? ?#PTSD ?- following with [facility] provider ?- having a hard time seeing a [facility] mental [facility] provider - for talk therapy ?- was working from home generally at the end of 2022 ?- went back to normal work- and things went "downhill"  ?- feels he is in a toxic work situation - has been having a hard time concentrating at work due to co-workers and side conversations ?- felt he has been treated poorly at work ?- every day going to work - having thoughts of self harm ?- Taking zoloft 200 mg daily ?- has not had any medication changes  ?- things have escalated over the last 3 months and worse in the last 4 weeks  ? ?Review of Systems ? ? ?Social History  ? ?Tobacco Use  ?Smoking Status Former  ? Years: 10.00  ? Types: Cigarettes  ? Quit date: 10/24/2004  ? Years since quitting: 17.3  ?Smokeless Tobacco Former  ? Types: Chew  ? Quit date: 10/24/2004  ? ? ? ?   ?Objective:  ?  ?BP Readings from Last 3 Encounters:  ?02/20/22 122/90  ?12/20/20 110/82  ?08/15/20 118/84  ? ?Wt Readings from Last 3 Encounters:  ?02/20/22 245 lb 6 oz (111.3 kg)  ?12/20/20 252 lb 6 oz (114.5 kg)  ?08/15/20 246 lb 4 oz (111.7 kg)  ? ? ?BP 122/90   Pulse 84   Temp 98 ?F (36.7 ?C) (Oral)   Ht 6' 3.25" (1.911 m)   Wt 245 lb 6 oz (111.3 kg)   SpO2 98%   BMI 30.47 kg/m?  ? ? ?Physical Exam ?Constitutional:   ?   Appearance: Normal appearance. He is not ill-appearing or diaphoretic.  ?HENT:  ?   Right Ear: External ear normal.  ?   Left Ear: External ear normal.  ?   Nose: Nose normal.  ?Eyes:  ?   General: No scleral icterus. ?   Extraocular Movements: Extraocular movements intact.  ?   Conjunctiva/sclera: Conjunctivae normal.  ?Cardiovascular:  ?   Rate and Rhythm: Normal rate.  ?Pulmonary:  ?   Effort: Pulmonary effort is normal.  ?Musculoskeletal:  ?   Cervical back: Neck supple.  ?Skin: ?   General: Skin is warm and dry.   ?Neurological:  ?   Mental Status: He is alert. Mental status is at baseline.  ?Psychiatric:     ?   Mood and Affect: Mood normal.  ? ? ? ?  02/20/2022  ? 12:32 PM 07/17/2020  ?  2:42 PM  ?GAD 7 : Generalized Anxiety Score  ?Nervous, Anxious, on Edge 3 0  ?Control/stop worrying 0 0  ?Worry too much - different things 0 0  ?Trouble relaxing 2 0  ?Restless 2 0  ?Easily annoyed or irritable 3 0  ?Afraid - awful might happen 0 0  ?Total GAD 7 Score 10 0  ?Anxiety Difficulty Extremely difficult   ? ? ? ?  02/20/2022  ? 12:32 PM 07/17/2020  ?  2:42 PM  ?Depression screen PHQ 2/9  ?Decreased Interest 2 0  ?Down, Depressed, Hopeless 3 0  ?PHQ - 2 Score 5 0  ?Altered sleeping 2   ?Tired, decreased energy 1   ?Change in appetite 0   ?Feeling bad or failure about yourself  3   ?Trouble concentrating 2   ?Moving slowly or  fidgety/restless 0   ?Suicidal thoughts 3   ?PHQ-9 Score 16   ?Difficult doing work/chores Extremely dIfficult   ? ? ? ? ?   ?Assessment & Plan:  ? ?Problem List Items Addressed This Visit   ? ?  ? Other  ? Post traumatic stress disorder (PTSD) - Primary  ?  Stop Zoloft, start citalopram 40 mg.  Start therapy ?  ?  ? Relevant Medications  ? citalopram (CELEXA) 40 MG tablet  ? Other Relevant Orders  ? Ambulatory referral to Psychology  ? Current moderate episode of major depressive disorder without prior episode (HCC)  ?  Worsening symptoms currently on Zoloft 200 mg.  Complicated by suicidal ideation and severe work stress.  FMLA paperwork for disability today for 8 weeks while he works on treatment.  Stop Zoloft, start citalopram 40 mg daily, update if having a hard time with transition.  Safety plan and risk for SI discussed.  Number for therapy provided as well as referral for urgent therapy due to severe symptoms.  Follow-up in 4 weeks.  ?  ?  ? Relevant Medications  ? citalopram (CELEXA) 40 MG tablet  ? Other Relevant Orders  ? Ambulatory referral to Psychology  ? Generalized anxiety disorder  ?  Stop Zoloft,  start citalopram and therapy. ?  ?  ? Relevant Medications  ? citalopram (CELEXA) 40 MG tablet  ? Other Relevant Orders  ? Ambulatory referral to Psychology  ? ? ? ?Return in about 4 weeks (around 03/20/2022) for anxiety and depression . ? ?Lynnda Child, MD ? ?This visit occurred during the SARS-CoV-2 public health emergency.  Safety protocols were in place, including screening questions prior to the visit, additional usage of staff PPE, and extensive cleaning of exam room while observing appropriate contact time as indicated for disinfecting solutions.  ? ?

## 2022-02-20 NOTE — Assessment & Plan Note (Signed)
Stop Zoloft, start citalopram 40 mg.  Start therapy ?

## 2022-02-20 NOTE — Patient Instructions (Signed)
You are going to start a new antidepressant medication.  ? ?1) Stop Zoloft 200 mg ?2) Start Citalopram 40 mg ? ?If getting side effects from changing medications update and we can adjust the medication ? ?One of the risks of this medication is increase in suicidal thoughts.  ? ?Your suicide Action plan is as follows:  ?1) Call [facility] ?2) Call the Suicide Hotline [phone] which is available 24 hours ?3) Call the Clinic ? ? ? ?The most common side effect is stomach upset. If this happens it means the medication is working. It should get better in 1-3 weeks.  ? ?Medication for depression and anxiety often takes 6-8 weeks to have a noticeable difference so stick with it. Also the best way for recovery is taking medication and seeing a therapist -- this is so important.  ? ? ?How to help anxiety and depression ? ?1) Regular Exercise - walking, jogging, cycling, dancing, strength training - aiming for 150 minutes of exercise a week ?--> Yoga has been shown in research to reduce depression and anxiety -- with even just one hour long session per week ? ?2)  Begin a Mindfulness/Meditation practice -- this can take a little as 3 minutes and is helpful for all kinds of mood issues ?-- You can find resources in books ?-- Or you can download apps like  ?---- Headspace App  ?---- Calm  ?---- Insignt Timer ?---- Stop, Breathe & Think ? ?# With each of these Apps - you should decline the "start free trial" offer and as you search through the App should be able to access some of their free content. You can also chose to pay for the content if you find one that works well for you.  ? ?# Many of them also offer sleep specific content which may help with insomnia ? ?3) Healthy Diet ?-- Avoid or decrease Caffeine ?-- Avoid or decrease Alcohol ?-- Drink plenty of water, have a balanced diet ?-- Avoid cigarettes and marijuana (as well as other recreational drugs) ? ?4) Find a therapist  ?-- [facility] is one option.  Call [phone] ?-- Or you can check out www.psychologytoday.com -- you can read bios of therapists and see if they accept insurance ?-- Check with your insurance to see if you have coverage and who may take your insurance ? ?

## 2022-02-20 NOTE — Assessment & Plan Note (Signed)
Worsening symptoms currently on Zoloft 200 mg.  Complicated by suicidal ideation and severe work stress.  FMLA paperwork for disability today for 8 weeks while he works on treatment.  Stop Zoloft, start citalopram 40 mg daily, update if having a hard time with transition.  Safety plan and risk for SI discussed.  Number for therapy provided as well as referral for urgent therapy due to severe symptoms.  Follow-up in 4 weeks.  ?

## 2022-02-22 ENCOUNTER — Ambulatory Visit (INDEPENDENT_AMBULATORY_CARE_PROVIDER_SITE_OTHER): Payer: 59 | Admitting: Psychologist

## 2022-02-22 DIAGNOSIS — F331 Major depressive disorder, recurrent, moderate: Secondary | ICD-10-CM | POA: Diagnosis not present

## 2022-02-22 DIAGNOSIS — F4310 Post-traumatic stress disorder, unspecified: Secondary | ICD-10-CM | POA: Diagnosis not present

## 2022-02-22 NOTE — Plan of Care (Signed)
Goals ?Alleviate depressive symptoms ?Recognize, accept, and cope with depressive feelings ?Develop healthy thinking patterns ?Develop healthy interpersonal relationships ? ?Objectives ?Cooperate with a medication evaluation by a physician ?Verbalize an accurate understanding of depression ?Verbalize an understanding of the treatment ?Identify and replace thoughts that support depression ?Learn and implement behavioral strategies ?Verbalize an understanding and resolution of current interpersonal problems ?Learn and implement problem solving and decision making skills ?Learn and implement conflict resolution skills to resolve interpersonal problems ?Verbalize an understanding of healthy and unhealthy emotions verbalize insight into how past relationships may be influence current experiences with depression ?Use mindfulness and acceptance strategies and increase value based behavior  ?Increase hopeful statements about the future.  ?Interventions ?Consistent with treatment model, discuss how change in cognitive, behavioral, and interpersonal can help client alleviate depression ?CBT ?Behavioral activation help the client explore the relationship, nature of the dispute,  ?Help the client develop new interpersonal skills and relationships ?Conduct Problem so living therapy ?Teach conflict resolution skills ?Use a process-experiential approach ?Conduct TLDP ?Conduct ACT ?Evaluate need for psychotropic medication ?Monitor adherence to medication  ? ?Goals ?Reduce overall frequency, intensity, and duration of trauma related symptoms ?Stabilize  trauma while increasing ability to function ?Enhance ability to effectively cope with triggers, reminders, flashbacks, moods, and cognitions of trauma ?Learn and implement coping skills that result in a reduction of trauma related symptomsl ? ?Objectives ?Verbalize an understanding of the cognitive, physiological, and behavioral components of post traumatic stress disorder ?Describe  in detail reminders of the triggers related to trauma  ?Learn and implement strategies to manage thoughts, behaviors, and feelings of trauma ?Learning and implement calming skills to reduce overall trauma ?Verbalize an understanding of the role that cognitive biases play in excessive irrational worry and persistent trauma symptoms ?Learn and implement personal skills to manage challenging situations ?Learn and implement problem solving strategies ?Identify and engage in pleasant activities ?Learn to accept limitations in life and commit to tolerating, rather than avoiding, unpleasant emotions while accomplishing meaningful goals ?Maintain involvement in work, family, and social activities ?Reestablish a consistent sleep-wake cycle ?Cooperate with a medical evaluation  ?Interventions ?Engage the patient in behavioral activation ?Use instruction, modeling, and role-playing to build the client's general social, communication, and/or conflict resolution skills ?Use Acceptance and Commitment Therapy to help client accept uncomfortable realities in order to accomplish value-consistent goals ?Support the client in following through with work, family, and social activities ?Teach and implement sleep hygiene practices  ?Refer the patient to a physician for a psychotropic medication consultation ?Monito the clint's psychotropic medication compliance ?Discuss how trauma typically involves excessive worry, various bodily expressions of tension, and avoidance of what is threatening that interact to maintain the problem  ?Teach the patient relaxation skills ?Assign the patient homework ?Discuss examples demonstrating that unrealistic worry overestimates the probability of threats and underestimates patient's ability  ?Assist the patient in analyzing his or her worries ?Help patient understand that avoidance is reinforcing  ? ? ?

## 2022-02-22 NOTE — Progress Notes (Signed)
                Merwin Breden, PsyD 
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
## 2022-02-25 ENCOUNTER — Telehealth: Payer: Self-pay

## 2022-02-25 NOTE — Telephone Encounter (Signed)
Patient called wanted to now if he can get paper copy of Celexa to get filled at [facility] in [place] or sent electronically if able to.  ?

## 2022-02-26 ENCOUNTER — Other Ambulatory Visit: Payer: Self-pay

## 2022-02-26 DIAGNOSIS — F4310 Post-traumatic stress disorder, unspecified: Secondary | ICD-10-CM

## 2022-02-26 DIAGNOSIS — F321 Major depressive disorder, single episode, moderate: Secondary | ICD-10-CM

## 2022-02-26 DIAGNOSIS — F411 Generalized anxiety disorder: Secondary | ICD-10-CM

## 2022-02-26 MED ORDER — CITALOPRAM HYDROBROMIDE 40 MG PO TABS: 40.0000 mg | ORAL_TABLET | Freq: Every day | ORAL | 3 refills | Status: DC

## 2022-02-26 NOTE — Telephone Encounter (Signed)
Rx sent to [facility] in [place]. Notified pt by VM per DPR. ?

## 2022-03-05 ENCOUNTER — Ambulatory Visit: Payer: 59 | Admitting: Psychologist

## 2022-03-18 ENCOUNTER — Ambulatory Visit (INDEPENDENT_AMBULATORY_CARE_PROVIDER_SITE_OTHER): Payer: 59 | Admitting: Psychologist

## 2022-03-18 DIAGNOSIS — F4310 Post-traumatic stress disorder, unspecified: Secondary | ICD-10-CM | POA: Diagnosis not present

## 2022-03-18 DIAGNOSIS — F331 Major depressive disorder, recurrent, moderate: Secondary | ICD-10-CM | POA: Diagnosis not present

## 2022-03-18 NOTE — Progress Notes (Signed)
                Neeka Urista, PsyD 

## 2022-03-18 NOTE — Progress Notes (Signed)
[facility] Counselor/Therapist Progress Note ? ?Patient ID: Robert Mathews, MRN: [id number],   ? ?Date: 03/18/2022 ? ?Time Spent: 12:00 pm to 12:44 pm; total time: 44 minutes ? ? This session was held via video webex teletherapy due to the coronavirus risk at this time. The patient consented to video teletherapy and was located at his home during this session. He is aware it is the responsibility of the patient to secure confidentiality on his end of the session. The provider was in a private home office for the duration of this session. Limits of confidentiality were discussed with the patient.  ? ?Treatment Type: Individual Therapy ? ?Reported Symptoms: Stress ? ?Mental Status Exam: ?Appearance:  Well Groomed     ?Behavior: Appropriate  ?Motor: Normal  ?Speech/Language:  Clear and Coherent  ?Affect: Appropriate  ?Mood: normal  ?Thought process: normal  ?Thought content:   WNL  ?Sensory/Perceptual disturbances:   WNL  ?Orientation: oriented to person, place, and time/date  ?Attention: Good  ?Concentration: Good  ?Memory: WNL  ?Fund of knowledge:  Good  ?Insight:   Good  ?Judgment:  Fair  ?Impulse Control: Good  ? ?Risk Assessment: ?Danger to Self:  No ?Self-injurious Behavior: No ?Danger to Others: No ?Duty to Warn:no ?Physical Aggression / Violence:No  ?Access to Firearms a concern: No  ?Gang Involvement:No  ? ?Subjective: Beginning the session, patient described himself as doing well while reflecting on events since the intake. After reviewing the treatment plan, patient asked for coping skills. After discussing, practicing, and processing coping skills, patient described himself as better. He was agreeable to homework and following up. He denied suicidal and homicidal ideation.   ? ?Interventions:  Worked on developing a therapeutic relationship with the patient using active listening and reflective statements. Provided emotional support using empathy and validation. Reviewed the treatment plan with the  patient. Reviewed events since the intake. Identified goals for the session. Normalized and validated expressed thoughts and emotions. Provided psychoeducation about mindfulness, mindful moments, mindful videos, and guided imagery. Practiced and processed mindfulness and guided imagery. Praised patient for experiencing less distress. Assisted in problem solving. Assigned homework. Assessed for suicidal and homicidal ideation.  ? ?Homework: Implement mindfulness and guided imagery ? ?Next Session: Review homework, defusion, PMR, and worry box. Emotional support.  ? ?Diagnosis: F43.10 Posttraumatic stress disorder and F33.1 major depressive affective disorder, recurrent, moderate ? ?Plan:  ?Goals ?Reduce overall frequency, intensity, and duration of trauma related symptoms ?Stabilize  trauma while increasing ability to function ?Enhance ability to effectively cope with triggers, reminders, flashbacks, moods, and cognitions of trauma ?Learn and implement coping skills that result in a reduction of trauma related symptoms ?Alleviate depressive symptoms ?Recognize, accept, and cope with depressive feelings ?Develop healthy thinking patterns ?Develop healthy interpersonal relationships ? ?Objectives target date for all objectives is 02/23/2023 ?Verbalize an understanding of the cognitive, physiological, and behavioral components of post traumatic stress disorder ?Describe in detail reminders of the triggers related to trauma  ?Learn and implement strategies to manage thoughts, behaviors, and feelings of trauma ?Learning and implement calming skills to reduce overall trauma ?Verbalize an understanding of the role that cognitive biases play in excessive irrational worry and persistent trauma symptoms ?Learn and implement personal skills to manage challenging situations ?Learn and implement problem solving strategies ?Identify and engage in pleasant activities ?Learn to accept limitations in life and commit to tolerating,  rather than avoiding, unpleasant emotions while accomplishing meaningful goals ?Maintain involvement in work, family, and social activities ?Reestablish a consistent  sleep-wake cycle ?Cooperate with a medical evaluation  ?Cooperate with a medication evaluation by a physician ?Verbalize an accurate understanding of depression ?Verbalize an understanding of the treatment ?Identify and replace thoughts that support depression ?Learn and implement behavioral strategies ?Verbalize an understanding and resolution of current interpersonal problems ?Learn and implement problem solving and decision making skills ?Learn and implement conflict resolution skills to resolve interpersonal problems ?Verbalize an understanding of healthy and unhealthy emotions verbalize insight into how past relationships may be influence current experiences with depression ?Use mindfulness and acceptance strategies and increase value based behavior  ?Increase hopeful statements about the future.  ?Interventions ?Engage the patient in behavioral activation ?Use instruction, modeling, and role-playing to build the client's general social, communication, and/or conflict resolution skills ?Use Acceptance and Commitment Therapy to help client accept uncomfortable realities in order to accomplish value-consistent goals ?Support the client in following through with work, family, and social activities ?Teach and implement sleep hygiene practices  ?Refer the patient to a physician for a psychotropic medication consultation ?Monito the clint's psychotropic medication compliance ?Discuss how trauma typically involves excessive worry, various bodily expressions of tension, and avoidance of what is threatening that interact to maintain the problem  ?Teach the patient relaxation skills ?Assign the patient homework ?Discuss examples demonstrating that unrealistic worry overestimates the probability of threats and underestimates patient's ability  ?Assist the  patient in analyzing his or her worries ?Help patient understand that avoidance is reinforcing  ?Consistent with treatment model, discuss how change in cognitive, behavioral, and interpersonal can help client alleviate depression ?CBT ?Behavioral activation help the client explore the relationship, nature of the dispute,  ?Help the client develop new interpersonal skills and relationships ?Conduct Problem so living therapy ?Teach conflict resolution skills ?Use a process-experiential approach ?Conduct TLDP ?Conduct ACT ?Evaluate need for psychotropic medication ?Monitor adherence to medication  ? ?The patient and clinician reviewed the treatment plan on 03/18/2022. The patient approved of the treatment plan.  ? ?Hilbert Corrigan, PsyD ? ? ? ?

## 2022-03-20 ENCOUNTER — Telehealth: Payer: Self-pay | Admitting: Family Medicine

## 2022-03-20 DIAGNOSIS — Z0279 Encounter for issue of other medical certificate: Secondary | ICD-10-CM

## 2022-03-20 NOTE — Telephone Encounter (Signed)
Pt dropped off form for provider to fill out, I attached charge form and put in providers folder.  ?

## 2022-03-21 NOTE — Telephone Encounter (Signed)
Paperwork placed in Dr. Elmyra Ricks inbox in office ?

## 2022-03-21 NOTE — Telephone Encounter (Signed)
Patient called to see if you had any questions about forms he dropped off yesterday. Needed to have sent in on Friday.  ?

## 2022-03-21 NOTE — Telephone Encounter (Addendum)
Reviewed patient's visit with Dr. Selena Batten from 02/20/2022.  It looks like she recommended 8 weeks of leave total, so I will provide an additional 4 weeks today.  ? ?It looks like Dr. Selena Batten wanted to see the patient back 4 weeks after her visit with him on 02/20/2022.  I do not see an appointment scheduled.  Please schedule an appointment with PCP. ? ?Completed FMLA paperwork and placed in Christy's inbox. ? ? ?

## 2022-03-21 NOTE — Telephone Encounter (Signed)
Paperwork given to Robert Mathews since Dr. Einar Pheasant is out of the office until 03/25/22 ?

## 2022-03-22 NOTE — Telephone Encounter (Signed)
Paperwork faxed and confirmed. Pt notified and scheduled for follow-up visit on 03/26/22.  ?

## 2022-03-26 ENCOUNTER — Ambulatory Visit (INDEPENDENT_AMBULATORY_CARE_PROVIDER_SITE_OTHER): Payer: 59 | Admitting: Family Medicine

## 2022-03-26 VITALS — BP 138/98 | HR 73 | Temp 97.8°F | Ht 75.25 in | Wt 238.2 lb

## 2022-03-26 DIAGNOSIS — E782 Mixed hyperlipidemia: Secondary | ICD-10-CM | POA: Diagnosis not present

## 2022-03-26 DIAGNOSIS — F411 Generalized anxiety disorder: Secondary | ICD-10-CM

## 2022-03-26 DIAGNOSIS — F321 Major depressive disorder, single episode, moderate: Secondary | ICD-10-CM | POA: Diagnosis not present

## 2022-03-26 DIAGNOSIS — E038 Other specified hypothyroidism: Secondary | ICD-10-CM

## 2022-03-26 DIAGNOSIS — R030 Elevated blood-pressure reading, without diagnosis of hypertension: Secondary | ICD-10-CM | POA: Diagnosis not present

## 2022-03-26 LAB — CBC
HCT: 40.7 % (ref 39.0–52.0)
Hemoglobin: 14.1 g/dL (ref 13.0–17.0)
MCHC: 34.5 g/dL (ref 30.0–36.0)
MCV: 100.1 fl — ABNORMAL HIGH (ref 78.0–100.0)
Platelets: 264 10*3/uL (ref 150.0–400.0)
RBC: 4.06 Mil/uL — ABNORMAL LOW (ref 4.22–5.81)
RDW: 13.1 % (ref 11.5–15.5)
WBC: 5.5 10*3/uL (ref 4.0–10.5)

## 2022-03-26 LAB — COMPREHENSIVE METABOLIC PANEL
ALT: 23 U/L (ref 0–53)
AST: 20 U/L (ref 0–37)
Albumin: 4.7 g/dL (ref 3.5–5.2)
Alkaline Phosphatase: 56 U/L (ref 39–117)
BUN: 22 mg/dL (ref 6–23)
CO2: 26 mEq/L (ref 19–32)
Calcium: 9.7 mg/dL (ref 8.4–10.5)
Chloride: 101 mEq/L (ref 96–112)
Creatinine, Ser: 1.02 mg/dL (ref 0.40–1.50)
GFR: 87.69 mL/min (ref >60.00)
Glucose, Bld: 99 mg/dL (ref 70–99)
Potassium: 4.3 mEq/L (ref 3.5–5.1)
Sodium: 137 mEq/L (ref 135–145)
Total Bilirubin: 0.5 mg/dL (ref 0.2–1.2)
Total Protein: 7.3 g/dL (ref 6.0–8.3)

## 2022-03-26 LAB — LIPID PANEL
Cholesterol: 238 mg/dL — ABNORMAL HIGH (ref 0–200)
HDL: 49.5 mg/dL (ref >39.00)
NonHDL: 188.19
Total CHOL/HDL Ratio: 5
Triglycerides: 210 mg/dL — ABNORMAL HIGH (ref 0.0–149.0)
VLDL: 42 mg/dL — ABNORMAL HIGH (ref 0.0–40.0)

## 2022-03-26 LAB — VITAMIN D 25 HYDROXY (VIT D DEFICIENCY, FRACTURES): VITD: 24.18 ng/mL — ABNORMAL LOW (ref 30.00–100.00)

## 2022-03-26 LAB — LDL CHOLESTEROL, DIRECT: Direct LDL: 132 mg/dL

## 2022-03-26 LAB — TSH: TSH: 3.2 u[IU]/mL (ref 0.35–5.50)

## 2022-03-26 NOTE — Assessment & Plan Note (Signed)
Improving.  Continue Celexa 40 mg daily and therapy.  Continue to remain out of work for approximately 4 more weeks, he will check with work to see if they can accommodate him returning at 20 hours a week for 1 to 2 weeks before reaching full-time.  We will complete paperwork if needed. ?

## 2022-03-26 NOTE — Progress Notes (Signed)
? ?Subjective:  ? ?  ?Robert Mathews is a 46 y.o. male presenting for Follow-up (Anx/dep) ?  ? ? ?HPI ? ?#depression/anxiety ?- following with Dr. Michail Sermon - therapy ?- had some issues getting medication filled  ?- has only been on citalopram for 2-3 weeks ?- has noticed some appetite suppression on this medication ?- has to make a mental decision to eat ?- has not been working ?- is feeling better overall ?- does still have an a lot of dread regarding returning to work ?- physically his back is better b/c he is swimming more often ?-   ? ?Is applying for hybrid positions at work - externally and internally  ?- is hoping he could transition back to full-time ? ? ? ?Review of Systems ? ? ?Social History  ? ?Tobacco Use  ?Smoking Status Former  ? Years: 10.00  ? Types: Cigarettes  ? Quit date: 10/24/2004  ? Years since quitting: 17.4  ?Smokeless Tobacco Former  ? Types: Chew  ? Quit date: 10/24/2004  ? ? ? ?   ?Objective:  ?  ?BP Readings from Last 3 Encounters:  ?03/26/22 (!) 138/98  ?02/20/22 122/90  ?12/20/20 110/82  ? ?Wt Readings from Last 3 Encounters:  ?03/26/22 238 lb 4 oz (108.1 kg)  ?02/20/22 245 lb 6 oz (111.3 kg)  ?12/20/20 252 lb 6 oz (114.5 kg)  ? ? ?BP (!) 138/98   Pulse 73   Temp 97.8 ?F (36.6 ?C) (Oral)   Ht 6' 3.25" (1.911 m)   Wt 238 lb 4 oz (108.1 kg)   SpO2 97%   BMI 29.58 kg/m?  ? ? ?Physical Exam ?Constitutional:   ?   Appearance: Normal appearance. He is not ill-appearing or diaphoretic.  ?HENT:  ?   Right Ear: External ear normal.  ?   Left Ear: External ear normal.  ?   Nose: Nose normal.  ?Eyes:  ?   General: No scleral icterus. ?   Extraocular Movements: Extraocular movements intact.  ?   Conjunctiva/sclera: Conjunctivae normal.  ?Cardiovascular:  ?   Rate and Rhythm: Normal rate and regular rhythm.  ?   Heart sounds: No murmur heard. ?Pulmonary:  ?   Effort: Pulmonary effort is normal. No respiratory distress.  ?   Breath sounds: Normal breath sounds. No wheezing.  ?Musculoskeletal:  ?    Cervical back: Neck supple.  ?Skin: ?   General: Skin is warm and dry.  ?Neurological:  ?   Mental Status: He is alert. Mental status is at baseline.  ?Psychiatric:     ?   Mood and Affect: Mood normal.  ? ? ? ?  03/26/2022  ? 12:16 PM 02/20/2022  ? 12:32 PM 07/17/2020  ?  2:42 PM  ?Depression screen PHQ 2/9  ?Decreased Interest 1 2 0  ?Down, Depressed, Hopeless 1 3 0  ?PHQ - 2 Score 2 5 0  ?Altered sleeping 1 2   ?Tired, decreased energy 1 1   ?Change in appetite 2 0   ?Feeling bad or failure about yourself  1 3   ?Trouble concentrating 0 2   ?Moving slowly or fidgety/restless 0 0   ?Suicidal thoughts 1 3   ?PHQ-9 Score 8 16   ?Difficult doing work/chores Somewhat difficult Extremely dIfficult   ? ? ?  03/26/2022  ? 12:17 PM 02/20/2022  ? 12:32 PM 07/17/2020  ?  2:42 PM  ?GAD 7 : Generalized Anxiety Score  ?Nervous, Anxious, on Edge 2 3 0  ?  Control/stop worrying 1 0 0  ?Worry too much - different things 1 0 0  ?Trouble relaxing 1 2 0  ?Restless 0 2 0  ?Easily annoyed or irritable 2 3 0  ?Afraid - awful might happen 0 0 0  ?Total GAD 7 Score 7 10 0  ?Anxiety Difficulty Somewhat difficult Extremely difficult   ? ? ? ? ? ?   ?Assessment & Plan:  ? ?Problem List Items Addressed This Visit   ? ?  ? Endocrine  ? Hypothyroidism  ?  He gets some care through the [place], but is unclear on when his last labs were.  Check thyroid today given onset of worsening depression.  Continue Synthroid 150 mcg ? ?  ?  ? Relevant Orders  ? TSH  ?  ? Other  ? HYPERLIPIDEMIA  ?  Recheck lipids today.  Of note he did eat something. Previous was elevated.  ? ?  ?  ? Relevant Orders  ? Lipid panel  ? Current moderate episode of major depressive disorder without prior episode ([place]) - Primary  ?  Improving.  Discussed that this could be the change of medication but may be more likely secondary to initiating therapy and taking a break from work.  Continue Celexa 40 mg daily.  Follow-up in 6 weeks but did give information about Wellbutrin or BuSpar so if in  the next few weeks he notes minimal improvement he can reach out to start either medication. ? ?  ?  ? Relevant Orders  ? Vitamin D, 25-hydroxy  ? Generalized anxiety disorder  ?  Improving.  Continue Celexa 40 mg daily and therapy.  Continue to remain out of work for approximately 4 more weeks, he will check with work to see if they can accommodate him returning at 20 hours a week for 1 to 2 weeks before reaching full-time.  We will complete paperwork if needed. ? ?  ?  ? Relevant Orders  ? Vitamin D, 25-hydroxy  ? Elevated blood pressure reading  ?  Advised home monitoring.  Labs today to further assess. ? ?  ?  ? Relevant Orders  ? Comprehensive metabolic panel  ? CBC  ? ? ? ?Return in about 6 weeks (around 05/07/2022) for anxiety/depression. ? ?Lesleigh Noe, MD ? ? ? ?

## 2022-03-26 NOTE — Assessment & Plan Note (Signed)
Improving.  Discussed that this could be the change of medication but may be more likely secondary to initiating therapy and taking a break from work.  Continue Celexa 40 mg daily.  Follow-up in 6 weeks but did give information about Wellbutrin or BuSpar so if in the next few weeks he notes minimal improvement he can reach out to start either medication. ?

## 2022-03-26 NOTE — Assessment & Plan Note (Signed)
Advised home monitoring.  Labs today to further assess. ?

## 2022-03-26 NOTE — Assessment & Plan Note (Signed)
Recheck lipids today.  Of note he did eat something. Previous was elevated.  ?

## 2022-03-26 NOTE — Patient Instructions (Addendum)
Sign up for mychart ? ?Or call to update if you need to.  ? ? ?Return to work  ?- Week 1-2: 20 hours a week ?- Week 3: full time ? ?Medications to consider if needed ?- Wellbutrin -- difficulty to control depression, weight neutral, does not have sexual effects  ? ?- Buspar - worsening anxiety, is dose 2-3 times day ? ?Your blood pressure high.  ? ?High blood pressure increases your risk for heart attack and stroke.  ? ? ?Please check your blood pressure 2-4 times a week.  ? ?To check your blood pressure ?1) Sit in a quiet and relaxed place for 5 minutes ?2) Make sure your feet are flat on the ground ?3) Consider checking first thing in the morning  ? ?Normal blood pressure is less than 140/90 ?Ideally you blood pressure should be around 120/80 ? ?Other ways you can reduce your blood pressure:  ?1) Regular exercise ?-- Try to get 150 minutes (30 minutes, 5 days a week) of moderate to vigorous aerobic excercise ?-- Examples: brisk walking (2.5 miles per hour), water aerobics, dancing, gardening, tennis, biking slower than 10 miles per hour ?2) DASH Diet - low fat meats, more fresh fruits and vegetables, whole grains, low salt ?3) Quit smoking if you smoke ?4) Loose 5-10% of your body weight ? ? ? ?

## 2022-03-26 NOTE — Assessment & Plan Note (Signed)
He gets some care through the [place], but is unclear on when his last labs were.  Check thyroid today given onset of worsening depression.  Continue Synthroid 150 mcg ?

## 2022-03-27 ENCOUNTER — Other Ambulatory Visit: Payer: Self-pay | Admitting: Family Medicine

## 2022-03-27 DIAGNOSIS — E559 Vitamin D deficiency, unspecified: Secondary | ICD-10-CM

## 2022-03-27 MED ORDER — VITAMIN D (ERGOCALCIFEROL) 1.25 MG (50000 UNIT) PO CAPS: 50000.0000 [IU] | ORAL_CAPSULE | ORAL | 1 refills | Status: DC

## 2022-03-27 NOTE — Telephone Encounter (Signed)
Faxed additional office notes from 03/26/22 office visit, to [employer], as requested by patient. Faxed confirmed.  ?

## 2022-04-01 ENCOUNTER — Ambulatory Visit (INDEPENDENT_AMBULATORY_CARE_PROVIDER_SITE_OTHER): Payer: 59 | Admitting: Psychologist

## 2022-04-01 DIAGNOSIS — F4310 Post-traumatic stress disorder, unspecified: Secondary | ICD-10-CM | POA: Diagnosis not present

## 2022-04-01 DIAGNOSIS — F331 Major depressive disorder, recurrent, moderate: Secondary | ICD-10-CM | POA: Diagnosis not present

## 2022-04-01 NOTE — Progress Notes (Signed)
[facility] Counselor/Therapist Progress Note ? ?Patient ID: Robert Mathews, MRN: [id number],   ? ?Date: 04/01/2022 ? ?Time Spent: 10:05 am to 10:46 am; total time: 41 minutes ? ? This session was held via video webex teletherapy due to the coronavirus risk at this time. The patient consented to video teletherapy and was located at his home during this session. He is aware it is the responsibility of the patient to secure confidentiality on his end of the session. The provider was in a private home office for the duration of this session. Limits of confidentiality were discussed with the patient.  ? ?Treatment Type: Individual Therapy ? ?Reported Symptoms: Doing better ? ?Mental Status Exam: ?Appearance:  Well Groomed     ?Behavior: Appropriate  ?Motor: Normal  ?Speech/Language:  Clear and Coherent  ?Affect: Appropriate  ?Mood: normal  ?Thought process: normal  ?Thought content:   WNL  ?Sensory/Perceptual disturbances:   WNL  ?Orientation: oriented to person, place, and time/date  ?Attention: Good  ?Concentration: Good  ?Memory: WNL  ?Fund of knowledge:  Good  ?Insight:   Good  ?Judgment:  Fair  ?Impulse Control: Good  ? ?Risk Assessment: ?Danger to Self:  No ?Self-injurious Behavior: No ?Danger to Others: No ?Duty to Warn:no ?Physical Aggression / Violence:No  ?Access to Firearms a concern: No  ?Gang Involvement:No  ? ?Subjective: Beginning the session, patient stated he has been doing fantastic and has not needed to use coping strategies. He acknowledged returning to work in a few weeks and not looking forward to it. He reflected on different options related to work and whether or not he should explore alternative employment options. As part of this, he reflected on his values.  He was agreeable to following up. He denied suicidal and homicidal ideation.   ? ?Interventions:  Worked on developing a therapeutic relationship with the patient using active listening and reflective statements. Provided emotional  support using empathy and validation. Used summary statements. Praised the patient for doing better and explored what has assisted the patient. Normalized and validated expressed thoughts. Used socratic questions to assist the patient gain insight into self. Challenged some of the thoughts expressed. Explored values and how they align with current life. Facilitated the patient doing a decisional analysis. Assessed for suicidal and homicidal ideation.  ? ?Homework: Practice strategies ? ?Next Session: Review homework, discuss returning to work  ? ?Diagnosis: F43.10 Posttraumatic stress disorder and F33.1 major depressive affective disorder, recurrent, moderate ? ?Plan:  ?Goals ?Reduce overall frequency, intensity, and duration of trauma related symptoms ?Stabilize  trauma while increasing ability to function ?Enhance ability to effectively cope with triggers, reminders, flashbacks, moods, and cognitions of trauma ?Learn and implement coping skills that result in a reduction of trauma related symptoms ?Alleviate depressive symptoms ?Recognize, accept, and cope with depressive feelings ?Develop healthy thinking patterns ?Develop healthy interpersonal relationships ? ?Objectives target date for all objectives is 02/23/2023 ?Verbalize an understanding of the cognitive, physiological, and behavioral components of post traumatic stress disorder ?Describe in detail reminders of the triggers related to trauma  ?Learn and implement strategies to manage thoughts, behaviors, and feelings of trauma ?Learning and implement calming skills to reduce overall trauma ?Verbalize an understanding of the role that cognitive biases play in excessive irrational worry and persistent trauma symptoms ?Learn and implement personal skills to manage challenging situations ?Learn and implement problem solving strategies ?Identify and engage in pleasant activities ?Learn to accept limitations in life and commit to tolerating, rather than avoiding,  unpleasant  emotions while accomplishing meaningful goals ?Maintain involvement in work, family, and social activities ?Reestablish a consistent sleep-wake cycle ?Cooperate with a medical evaluation  ?Cooperate with a medication evaluation by a physician ?Verbalize an accurate understanding of depression ?Verbalize an understanding of the treatment ?Identify and replace thoughts that support depression ?Learn and implement behavioral strategies ?Verbalize an understanding and resolution of current interpersonal problems ?Learn and implement problem solving and decision making skills ?Learn and implement conflict resolution skills to resolve interpersonal problems ?Verbalize an understanding of healthy and unhealthy emotions verbalize insight into how past relationships may be influence current experiences with depression ?Use mindfulness and acceptance strategies and increase value based behavior  ?Increase hopeful statements about the future.  ?Interventions ?Engage the patient in behavioral activation ?Use instruction, modeling, and role-playing to build the client's general social, communication, and/or conflict resolution skills ?Use Acceptance and Commitment Therapy to help client accept uncomfortable realities in order to accomplish value-consistent goals ?Support the client in following through with work, family, and social activities ?Teach and implement sleep hygiene practices  ?Refer the patient to a physician for a psychotropic medication consultation ?Monito the clint's psychotropic medication compliance ?Discuss how trauma typically involves excessive worry, various bodily expressions of tension, and avoidance of what is threatening that interact to maintain the problem  ?Teach the patient relaxation skills ?Assign the patient homework ?Discuss examples demonstrating that unrealistic worry overestimates the probability of threats and underestimates patient's ability  ?Assist the patient in analyzing his  or her worries ?Help patient understand that avoidance is reinforcing  ?Consistent with treatment model, discuss how change in cognitive, behavioral, and interpersonal can help client alleviate depression ?CBT ?Behavioral activation help the client explore the relationship, nature of the dispute,  ?Help the client develop new interpersonal skills and relationships ?Conduct Problem so living therapy ?Teach conflict resolution skills ?Use a process-experiential approach ?Conduct TLDP ?Conduct ACT ?Evaluate need for psychotropic medication ?Monitor adherence to medication  ? ?The patient and clinician reviewed the treatment plan on 03/18/2022. The patient approved of the treatment plan.  ? ?Hilbert Corrigan, PsyD ? ? ? ?

## 2022-04-05 ENCOUNTER — Telehealth: Payer: Self-pay | Admitting: Family Medicine

## 2022-04-05 NOTE — Telephone Encounter (Signed)
Please call pt and notify him that I am out of the office and can complete paperwork on Monday.   Please also ask patient why he suddenly would like to return to work.

## 2022-04-05 NOTE — Telephone Encounter (Signed)
Pt called and is needing Dr. Einar Pheasant to modify his return to work paperwork, he would like to start back at work immediately. Please return the call when possible.  Callback Number: [phone]

## 2022-04-05 NOTE — Telephone Encounter (Signed)
Spoke to pt and he states that he was just notified by his work that he is no longer under disability, so he is not getting paid. He does not want to go back to work this way, but feels like he has no other choice.  Pt states that his PCP can send a letter, on official letterhead, to [facility] at the disability office, explaining the following: - the current treatment plan - rx's that pt takes for diagnosis - Plan to transition safely back to work  - PCP's opinion on why pt should transition back to work.   Macon Large can be reached by phone at [phone]. Fax number: [phone]

## 2022-04-08 NOTE — Telephone Encounter (Signed)
I have completed a letter that he can access through [facility].   Will also route to MA to fax

## 2022-04-08 NOTE — Telephone Encounter (Signed)
Letter printed and faxed to Audrea Muscat at number provided.

## 2022-04-09 ENCOUNTER — Ambulatory Visit: Payer: 59 | Admitting: Family Medicine

## 2022-04-09 VITALS — BP 120/90 | HR 74 | Temp 97.5°F | Ht 75.25 in | Wt 235.1 lb

## 2022-04-09 DIAGNOSIS — F411 Generalized anxiety disorder: Secondary | ICD-10-CM

## 2022-04-09 DIAGNOSIS — F4310 Post-traumatic stress disorder, unspecified: Secondary | ICD-10-CM | POA: Diagnosis not present

## 2022-04-09 DIAGNOSIS — I10 Essential (primary) hypertension: Secondary | ICD-10-CM | POA: Diagnosis not present

## 2022-04-09 DIAGNOSIS — F321 Major depressive disorder, single episode, moderate: Secondary | ICD-10-CM | POA: Diagnosis not present

## 2022-04-09 MED ORDER — BUPROPION HCL ER (XL) 150 MG PO TB24: ORAL_TABLET | ORAL | 1 refills | Status: DC

## 2022-04-09 NOTE — Assessment & Plan Note (Signed)
Start wellbutrin. See depression plan. Cont therapy

## 2022-04-09 NOTE — Assessment & Plan Note (Signed)
PHQ-9 is slightly worse primarily due to worsening anxiety/depression at that thought of having to return to work. He continues to have significant spikes in symptoms with any increased stress. Workplace stress prior to his leave of absence was a significant factor leading to persistent SI. He has noticed improvement in SI since leaving work but continues to get spikes of symptoms. Continue Citalopram 40 mg. Start Wellbutrin 150 mg with plan to increase to 300 mg. Will place referral to psychiatry in the event we are not able to reach remission with 2 agents. Continue therapy. Discussed importance of regular breaks and mindfulness.   Would recommend another 2 weeks out of work to try to stabilize on new medication and plan for return to work slowly working 20 hours for at least 2 weeks due to potential for acute worsening of symptoms as work place stress is re-introduced.

## 2022-04-09 NOTE — Patient Instructions (Addendum)
Set boundaries, Find peace   How to help anxiety - without medication.   1) Regular Exercise - walking, jogging, cycling, dancing, strength training --> Yoga has been shown in research to reduce depression and anxiety -- with even just one hour long session per week  2)  Begin a Mindfulness/Meditation practice -- this can take a little as 3 minutes and is helpful for all kinds of mood issues -- You can find resources in books -- Or you can download apps like  ---- Headspace App (which currently has free content called "Weathering the Storm") ---- Calm (which has a few free options)  ---- Insignt Timer ---- Stop, Breathe & Think  # With each of these Apps - you should decline the "start free trial" offer and as you search through the App should be able to access some of their free content. You can also chose to pay for the content if you find one that works well for you.   # Many of them also offer sleep specific content which may help with insomnia  3) Healthy Diet -- Avoid or decrease Caffeine -- Avoid or decrease Alcohol -- Drink plenty of water, have a balanced diet -- Avoid cigarettes and marijuana (as well as other recreational drugs)

## 2022-04-09 NOTE — Assessment & Plan Note (Signed)
Pt will do home monitoring. Wonder if anxiety is contributing. If persistently elevated may need to start medication

## 2022-04-09 NOTE — Progress Notes (Signed)
Subjective:     Robert Mathews is a 46 y.o. male presenting for Follow-up (Anx/dep)     HPI  #Anxiety/depression - his short term disability benefits ran out - he is taking unpaid leave - still feeling very agitated - whenever he has some stress things spike - is in therapy - this is helping, he is trying to work on mindfulness  Hx of TBI and frontal lobe - hard time remembering  Still having thoughts of SI - no plan for action at this time, long time history  Exercising Eating well - but is losing weight, does protein shake when he doesn't feel like eating   Will get severe symptoms if something triggers worsening anxiety Triggers - short term disability ending  - fear of returning to work - work place stress   Does have a planned therapy session next week with plan for for 1 week follow-up  #HTN - does not check at home  Review of Systems   Social History   Tobacco Use  Smoking Status Former   Years: 10.00   Types: Cigarettes   Quit date: 10/24/2004   Years since quitting: 17.4  Smokeless Tobacco Former   Types: Chew   Quit date: 10/24/2004        Objective:    BP Readings from Last 3 Encounters:  04/09/22 120/90  03/26/22 (!) 138/98  02/20/22 122/90   Wt Readings from Last 3 Encounters:  04/09/22 235 lb 2 oz (106.7 kg)  03/26/22 238 lb 4 oz (108.1 kg)  02/20/22 245 lb 6 oz (111.3 kg)    BP 120/90   Pulse 74   Temp (!) 97.5 F (36.4 C) (Temporal)   Ht 6' 3.25" (1.911 m)   Wt 235 lb 2 oz (106.7 kg)   SpO2 98%   BMI 29.19 kg/m    Physical Exam Constitutional:      Appearance: Normal appearance. He is not ill-appearing or diaphoretic.  HENT:     Right Ear: External ear normal.     Left Ear: External ear normal.     Nose: Nose normal.  Eyes:     General: No scleral icterus.    Extraocular Movements: Extraocular movements intact.     Conjunctiva/sclera: Conjunctivae normal.  Cardiovascular:     Rate and Rhythm: Normal rate.   Pulmonary:     Effort: Pulmonary effort is normal.  Musculoskeletal:     Cervical back: Neck supple.  Skin:    General: Skin is warm and dry.  Neurological:     Mental Status: He is alert. Mental status is at baseline.  Psychiatric:        Mood and Affect: Mood normal.        04/09/2022   10:02 AM 03/26/2022   12:16 PM 02/20/2022   12:32 PM  Depression screen PHQ 2/9  Decreased Interest 1 1 2   Down, Depressed, Hopeless 1 1 3   PHQ - 2 Score 2 2 5   Altered sleeping 1 1 2   Tired, decreased energy 1 1 1   Change in appetite 2 2 0  Feeling bad or failure about yourself  2 1 3   Trouble concentrating 1 0 2  Moving slowly or fidgety/restless 0 0 0  Suicidal thoughts 1 1 3   PHQ-9 Score 10 8 16   Difficult doing work/chores Somewhat difficult Somewhat difficult Extremely dIfficult       04/09/2022   10:03 AM 03/26/2022   12:17 PM 02/20/2022   12:32 PM 07/17/2020  2:42 PM  GAD 7 : Generalized Anxiety Score  Nervous, Anxious, on Edge 2 2 3  0  Control/stop worrying 1 1 0 0  Worry too much - different things 0 1 0 0  Trouble relaxing 2 1 2  0  Restless 1 0 2 0  Easily annoyed or irritable 2 2 3  0  Afraid - awful might happen 0 0 0 0  Total GAD 7 Score 8 7 10  0  Anxiety Difficulty Somewhat difficult Somewhat difficult Extremely difficult           Assessment & Plan:   Problem List Items Addressed This Visit       Cardiovascular and Mediastinum   Essential hypertension    Pt will do home monitoring. Wonder if anxiety is contributing. If persistently elevated may need to start medication         Other   Post traumatic stress disorder (PTSD) - Primary   Relevant Medications   buPROPion (WELLBUTRIN XL) 150 MG 24 hr tablet   Current moderate episode of major depressive disorder without prior episode (HCC)    PHQ-9 is slightly worse primarily due to worsening anxiety/depression at that thought of having to return to work. He continues to have significant spikes in symptoms  with any increased stress. Workplace stress prior to his leave of absence was a significant factor leading to persistent SI. He has noticed improvement in SI since leaving work but continues to get spikes of symptoms. Continue Citalopram 40 mg. Start Wellbutrin 150 mg with plan to increase to 300 mg. Will place referral to psychiatry in the event we are not able to reach remission with 2 agents. Continue therapy. Discussed importance of regular breaks and mindfulness.   Would recommend another 2 weeks out of work to try to stabilize on new medication and plan for return to work slowly working 20 hours for at least 2 weeks due to potential for acute worsening of symptoms as work place stress is re-introduced.        Relevant Medications   buPROPion (WELLBUTRIN XL) 150 MG 24 hr tablet   Generalized anxiety disorder    Start wellbutrin. See depression plan. Cont therapy       Relevant Medications   buPROPion (WELLBUTRIN XL) 150 MG 24 hr tablet     Return in about 2 weeks (around 04/23/2022) for depression/anxiety.  Lesleigh Noe, MD

## 2022-04-10 DIAGNOSIS — Z0279 Encounter for issue of other medical certificate: Secondary | ICD-10-CM

## 2022-04-17 NOTE — Telephone Encounter (Signed)
Had to complete 2 letters due to error on the first. Routing to MA in case this needs to faxed

## 2022-04-18 ENCOUNTER — Ambulatory Visit (INDEPENDENT_AMBULATORY_CARE_PROVIDER_SITE_OTHER): Payer: 59 | Admitting: Psychologist

## 2022-04-18 DIAGNOSIS — F331 Major depressive disorder, recurrent, moderate: Secondary | ICD-10-CM

## 2022-04-18 DIAGNOSIS — F4310 Post-traumatic stress disorder, unspecified: Secondary | ICD-10-CM | POA: Diagnosis not present

## 2022-04-18 NOTE — Progress Notes (Signed)
[facility] Counselor/Therapist Progress Note  Patient ID: Robert Mathews, MRN: [id number],    Date: 04/18/2022  Time Spent: 12:05 pm to 12:30 pm; total time: 25 minutes   This session was held via video webex teletherapy due to the coronavirus risk at this time. The patient consented to video teletherapy and was located at his home during this session. He is aware it is the responsibility of the patient to secure confidentiality on his end of the session. The provider was in a private home office for the duration of this session. Limits of confidentiality were discussed with the patient.   Treatment Type: Individual Therapy  Reported Symptoms: Doing better  Mental Status Exam: Appearance:  Well Groomed     Behavior: Appropriate  Motor: Normal  Speech/Language:  Clear and Coherent  Affect: Appropriate  Mood: normal  Thought process: normal  Thought content:   WNL  Sensory/Perceptual disturbances:   WNL  Orientation: oriented to person, place, and time/date  Attention: Good  Concentration: Good  Memory: WNL  Fund of knowledge:  Good  Insight:   Good  Judgment:  Fair  Impulse Control: Good   Risk Assessment: Danger to Self:  No Self-injurious Behavior: No Danger to Others: No Duty to Warn:no Physical Aggression / Violence:No  Access to Firearms a concern: No  Gang Involvement:No   Subjective: Beginning the session, patient denied any concerns. He voiced that coping skills have helped him. He also voiced that he is waiting to hear back if he can work part time in person and part time at home. He also reflected on how he would assist others in looking at situations objectively and how to apply them to himself. He denied suicidal and homicidal ideation.    Interventions:  Worked on developing a therapeutic relationship with the patient using active listening and reflective statements. Provided emotional support using empathy and validation.  Reviewed events since the  last session. Praised the patient for doing well and explored what has assisted the patient. Processed how the coping strategies previously discussed have assisted the patient. Used socratic questions to assist the patient gain insight into self. Used solution focused therapy to assist the patient. Processed expressed thoughts and emotions. Discussed next steps for counseling. Explored the idea of looking at other jobs. Reviewed a decisional analysis related to work situation and looking at other jobs. Assisted in problem solving. Provided empathic statements. Assessed for suicidal and homicidal ideation.   Homework: Continue using previously discussed strategies  Next Session: Review homework, discuss returning to work   Diagnosis: F43.10 Posttraumatic stress disorder and F33.1 major depressive affective disorder, recurrent, moderate  Plan:  Goals Reduce overall frequency, intensity, and duration of trauma related symptoms Stabilize  trauma while increasing ability to function Enhance ability to effectively cope with triggers, reminders, flashbacks, moods, and cognitions of trauma Learn and implement coping skills that result in a reduction of trauma related symptoms Alleviate depressive symptoms Recognize, accept, and cope with depressive feelings Develop healthy thinking patterns Develop healthy interpersonal relationships  Objectives target date for all objectives is 02/23/2023 Verbalize an understanding of the cognitive, physiological, and behavioral components of post traumatic stress disorder Describe in detail reminders of the triggers related to trauma  Learn and implement strategies to manage thoughts, behaviors, and feelings of trauma Learning and implement calming skills to reduce overall trauma Verbalize an understanding of the role that cognitive biases play in excessive irrational worry and persistent trauma symptoms Learn and implement personal skills to manage  challenging  situations Learn and implement problem solving strategies Identify and engage in pleasant activities Learn to accept limitations in life and commit to tolerating, rather than avoiding, unpleasant emotions while accomplishing meaningful goals Maintain involvement in work, family, and social activities Reestablish a consistent sleep-wake cycle Cooperate with a medical evaluation  Cooperate with a medication evaluation by a physician Verbalize an accurate understanding of depression Verbalize an understanding of the treatment Identify and replace thoughts that support depression Learn and implement behavioral strategies Verbalize an understanding and resolution of current interpersonal problems Learn and implement problem solving and decision making skills Learn and implement conflict resolution skills to resolve interpersonal problems Verbalize an understanding of healthy and unhealthy emotions verbalize insight into how past relationships may be influence current experiences with depression Use mindfulness and acceptance strategies and increase value based behavior  Increase hopeful statements about the future.  Interventions Engage the patient in behavioral activation Use instruction, modeling, and role-playing to build the client's general social, communication, and/or conflict resolution skills Use Acceptance and Commitment Therapy to help client accept uncomfortable realities in order to accomplish value-consistent goals Support the client in following through with work, family, and social activities Teach and implement sleep hygiene practices  Refer the patient to a physician for a psychotropic medication consultation Monito the clint's psychotropic medication compliance Discuss how trauma typically involves excessive worry, various bodily expressions of tension, and avoidance of what is threatening that interact to maintain the problem  Teach the patient relaxation skills Assign the  patient homework Discuss examples demonstrating that unrealistic worry overestimates the probability of threats and underestimates patient's ability  Assist the patient in analyzing his or her worries Help patient understand that avoidance is reinforcing  Consistent with treatment model, discuss how change in cognitive, behavioral, and interpersonal can help client alleviate depression CBT Behavioral activation help the client explore the relationship, nature of the dispute,  Help the client develop new interpersonal skills and relationships Conduct Problem so living therapy Teach conflict resolution skills Use a process-experiential approach Conduct TLDP Conduct ACT Evaluate need for psychotropic medication Monitor adherence to medication   The patient and clinician reviewed the treatment plan on 03/18/2022. The patient approved of the treatment plan.   Hilbert Corrigan, PsyD

## 2022-05-03 ENCOUNTER — Other Ambulatory Visit: Payer: Self-pay | Admitting: Family Medicine

## 2022-05-03 DIAGNOSIS — F411 Generalized anxiety disorder: Secondary | ICD-10-CM

## 2022-05-03 DIAGNOSIS — F4310 Post-traumatic stress disorder, unspecified: Secondary | ICD-10-CM

## 2022-05-03 DIAGNOSIS — F321 Major depressive disorder, single episode, moderate: Secondary | ICD-10-CM

## 2022-05-08 ENCOUNTER — Ambulatory Visit: Payer: 59 | Admitting: Family Medicine

## 2022-05-13 ENCOUNTER — Encounter: Payer: Self-pay | Admitting: Family Medicine

## 2022-05-13 ENCOUNTER — Ambulatory Visit: Payer: 59 | Admitting: Family Medicine

## 2022-05-13 DIAGNOSIS — F4310 Post-traumatic stress disorder, unspecified: Secondary | ICD-10-CM | POA: Diagnosis not present

## 2022-05-13 DIAGNOSIS — F321 Major depressive disorder, single episode, moderate: Secondary | ICD-10-CM | POA: Diagnosis not present

## 2022-05-13 DIAGNOSIS — F411 Generalized anxiety disorder: Secondary | ICD-10-CM

## 2022-05-13 MED ORDER — BUPROPION HCL ER (XL) 300 MG PO TB24: 300.0000 mg | ORAL_TABLET | Freq: Every day | ORAL | 1 refills | Status: DC

## 2022-05-13 MED ORDER — CITALOPRAM HYDROBROMIDE 40 MG PO TABS: 40.0000 mg | ORAL_TABLET | Freq: Every day | ORAL | 1 refills | Status: DC

## 2022-05-13 NOTE — Assessment & Plan Note (Addendum)
Stable. Cont medications.

## 2022-05-21 ENCOUNTER — Other Ambulatory Visit: Payer: Self-pay | Admitting: Family Medicine

## 2022-05-21 DIAGNOSIS — E559 Vitamin D deficiency, unspecified: Secondary | ICD-10-CM

## 2022-06-07 ENCOUNTER — Other Ambulatory Visit: Payer: Self-pay | Admitting: Family Medicine

## 2022-06-07 DIAGNOSIS — F411 Generalized anxiety disorder: Secondary | ICD-10-CM

## 2022-06-07 DIAGNOSIS — F4310 Post-traumatic stress disorder, unspecified: Secondary | ICD-10-CM

## 2022-06-07 DIAGNOSIS — F321 Major depressive disorder, single episode, moderate: Secondary | ICD-10-CM

## 2022-12-29 NOTE — Progress Notes (Unsigned)
    Azari Hasler T. Beata Beason, MD, [place] [facility] [place], [address]  Phone: [phone]  FAX: [phone]  Robert Mathews - 47 y.o. male  MRN [id number]  Date of Birth: 12-20-1975  Date: 12/30/2022  PCP: Waunita Schooner, MD  Referral: Waunita Schooner, MD  No chief complaint on file.  Subjective:   Robert Mathews is a 47 y.o. very pleasant male patient with There is no height or weight on file to calculate BMI. who presents with the following:  He is a pleasant 47 year old gentleman, and he presents with some ongoing back pain.    Review of Systems is noted in the HPI, as appropriate  Objective:   There were no vitals taken for this visit.  GEN: No acute distress; alert,appropriate. PULM: Breathing comfortably in no respiratory distress PSYCH: Normally interactive.   Laboratory and Imaging Data:  Assessment and Plan:   ***

## 2022-12-30 ENCOUNTER — Ambulatory Visit: Payer: BC Managed Care – PPO | Admitting: Family Medicine

## 2022-12-30 ENCOUNTER — Encounter: Payer: Self-pay | Admitting: Family Medicine

## 2022-12-30 VITALS — BP 130/82 | HR 76 | Temp 97.7°F | Ht 75.25 in | Wt 246.2 lb

## 2022-12-30 DIAGNOSIS — M545 Low back pain, unspecified: Secondary | ICD-10-CM | POA: Diagnosis not present

## 2022-12-30 DIAGNOSIS — M549 Dorsalgia, unspecified: Secondary | ICD-10-CM

## 2022-12-30 MED ORDER — TIZANIDINE HCL 4 MG PO TABS: 4.0000 mg | ORAL_TABLET | Freq: Every evening | ORAL | 2 refills | Status: DC | PRN

## 2022-12-30 MED ORDER — PREDNISONE 20 MG PO TABS: ORAL_TABLET | ORAL | 0 refills | Status: DC

## 2023-03-26 ENCOUNTER — Other Ambulatory Visit: Payer: Self-pay | Admitting: Family Medicine

## 2023-03-26 NOTE — Telephone Encounter (Signed)
Last office visit 12/30/2022 for acute back pain.  Last refilled 12/30/2022 for #30 with 2 refills.  Next Appt: No future appointments.

## 2023-04-09 ENCOUNTER — Ambulatory Visit: Payer: BC Managed Care – PPO | Admitting: Family Medicine

## 2023-04-10 ENCOUNTER — Ambulatory Visit (INDEPENDENT_AMBULATORY_CARE_PROVIDER_SITE_OTHER)
Admission: RE | Admit: 2023-04-10 | Discharge: 2023-04-10 | Disposition: A | Discharge status: Home / Self Care | Payer: BC Managed Care – PPO | Source: Ambulatory Visit | Attending: Family Medicine | Admitting: Family Medicine

## 2023-04-10 ENCOUNTER — Encounter: Payer: Self-pay | Admitting: Family Medicine

## 2023-04-10 ENCOUNTER — Ambulatory Visit: Payer: BC Managed Care – PPO | Admitting: Family Medicine

## 2023-04-10 VITALS — BP 130/90 | HR 98 | Temp 97.8°F | Ht 75.25 in | Wt 244.0 lb

## 2023-04-10 DIAGNOSIS — M5412 Radiculopathy, cervical region: Secondary | ICD-10-CM | POA: Diagnosis not present

## 2023-04-10 DIAGNOSIS — M542 Cervicalgia: Secondary | ICD-10-CM | POA: Diagnosis not present

## 2023-04-10 MED ORDER — PREDNISONE 20 MG PO TABS
ORAL_TABLET | ORAL | 0 refills | Status: DC
Start: 2023-04-10 — End: 2023-08-26

## 2023-04-10 MED ORDER — TRAMADOL HCL 50 MG PO TABS: 50.0000 mg | ORAL_TABLET | Freq: Three times a day (TID) | ORAL | 0 refills | Status: AC | PRN

## 2023-04-10 NOTE — Progress Notes (Signed)
Patient ID: Robert Mathews, male    DOB: 12/03/1975, 47 y.o.   MRN: [id number]  This visit was conducted in person.  BP (!) 130/90 (BP Location: Left Arm, Patient Position: Standing, Cuff Size: Large)   Pulse 98   Temp 97.8 F (36.6 C) (Temporal)   Ht 6' 3.25" (1.911 m)   Wt 244 lb (110.7 kg)   SpO2 100%   BMI 30.30 kg/m    CC:  Chief Complaint  Patient presents with   Neck Pain    Radiates down to shoulder/arm    Subjective:   HPI: Robert Mathews is a 47 y.o. male presenting on 04/10/2023 for Neck Pain (Radiates down to shoulder/arm)  Previous PCP : Selena Batten  Has not yet established with new provider. Reviewed OV with Dr. Patsy Lager December 30, 2022 for acute lumbar back pain Treated with muscle relaxants and tizanidine   Hx of spinal stenosis, ongoing spinal cord stimulator.  DDD, chronic lumbar back pain Hx of pinched nerve in neck.. seen at [place] in past year... showed DDD in neck as well.  [place] [facility]. DG   New onset Pain in last 4 days...awoke with sudden pain  Gradually worsened. Tried meloxicam 15 mg daily... not helping at all. Right sided, radiates to right arm. Any neck or upper body movement makes pain shoot to right upper. Keeping I'm up at night. Feel little better with arm across chest.  No recent falls or change in activity.  Shooting numness in back on right arm.  Cannot pick up lap top     Relevant past medical, surgical, family and social history reviewed and updated as indicated. Interim medical history since our last visit reviewed. Allergies and medications reviewed and updated. Outpatient Medications Prior to Visit  Medication Sig Dispense Refill   buPROPion (WELLBUTRIN XL) 300 MG 24 hr tablet Take 300 mg by mouth daily.     Cholecalciferol 100 MCG (4000 UT) CAPS Take 1 capsule by mouth daily.     citalopram (CELEXA) 40 MG tablet Take 1 tablet (40 mg total) by mouth daily. 90 tablet 1   meloxicam (MOBIC) 15 MG tablet Take 15 mg by mouth daily.      SYNTHROID 150 MCG tablet Take 1 tablet (150 mcg total) by mouth daily before breakfast. 90 tablet 1   tiZANidine (ZANAFLEX) 4 MG tablet TAKE 1 TABLET BY MOUTH AT BEDTIME AS NEEDED FOR MUSCLE SPASMS. 90 tablet 1   predniSONE (DELTASONE) 20 MG tablet 2 tabs po daily for 5 days, then 1 tab po daily for 5 days 15 tablet 0   No facility-administered medications prior to visit.     Per HPI unless specifically indicated in ROS section below Review of Systems  Constitutional:  Negative for fatigue and fever.  HENT:  Negative for ear pain.   Eyes:  Negative for pain.  Respiratory:  Negative for cough and shortness of breath.   Cardiovascular:  Negative for chest pain, palpitations and leg swelling.  Gastrointestinal:  Negative for abdominal pain.  Genitourinary:  Negative for dysuria.  Musculoskeletal:  Positive for neck pain and neck stiffness. Negative for arthralgias.  Neurological:  Negative for syncope, light-headedness and headaches.  Psychiatric/Behavioral:  Negative for dysphoric mood.    Objective:  BP (!) 130/90 (BP Location: Left Arm, Patient Position: Standing, Cuff Size: Large)   Pulse 98   Temp 97.8 F (36.6 C) (Temporal)   Ht 6' 3.25" (1.911 m)   Wt 244 lb (110.7 kg)   SpO2  100%   BMI 30.30 kg/m   Wt Readings from Last 3 Encounters:  04/10/23 244 lb (110.7 kg)  12/30/22 246 lb 4 oz (111.7 kg)  05/13/22 229 lb 6 oz (104 kg)      Physical Exam Constitutional:      Appearance: He is well-developed.  HENT:     Head: Normocephalic.     Right Ear: Hearing normal.     Left Ear: Hearing normal.     Nose: Nose normal.  Neck:     Thyroid: No thyroid mass or thyromegaly.     Vascular: No carotid bruit.     Trachea: Trachea normal.  Cardiovascular:     Rate and Rhythm: Normal rate and regular rhythm.     Pulses: Normal pulses.     Heart sounds: Heart sounds not distant. No murmur heard.    No friction rub. No gallop.     Comments: No peripheral edema Pulmonary:      Effort: Pulmonary effort is normal. No respiratory distress.     Breath sounds: Normal breath sounds.  Musculoskeletal:     Cervical back: Spasms, torticollis, tenderness and bony tenderness present. Pain with movement present. Decreased range of motion.     Thoracic back: Tenderness present.     Lumbar back: Normal.  Skin:    General: Skin is warm and dry.     Findings: No rash.  Neurological:     Mental Status: He is alert and oriented to person, place, and time.     Cranial Nerves: Cranial nerves 2-12 are intact.     Sensory: Sensation is intact.     Motor: Weakness present. No atrophy.     Comments: Right upper extremity: decreased shoulder and elbow flexion and extention, decreased  right grip.Marland Kitchen 4/5   Psychiatric:        Speech: Speech normal.        Behavior: Behavior normal.        Thought Content: Thought content normal.       Results for orders placed or performed in visit on 03/26/22  Comprehensive metabolic panel  Result Value Ref Range   Sodium 137 135 - 145 mEq/L   Potassium 4.3 3.5 - 5.1 mEq/L   Chloride 101 96 - 112 mEq/L   CO2 26 19 - 32 mEq/L   Glucose, Bld 99 70 - 99 mg/dL   BUN 22 6 - 23 mg/dL   Creatinine, Ser 1.61 0.40 - 1.50 mg/dL   Total Bilirubin 0.5 0.2 - 1.2 mg/dL   Alkaline Phosphatase 56 39 - 117 U/L   AST 20 0 - 37 U/L   ALT 23 0 - 53 U/L   Total Protein 7.3 6.0 - 8.3 g/dL   Albumin 4.7 3.5 - 5.2 g/dL   GFR 09.60 >45.40 mL/min   Calcium 9.7 8.4 - 10.5 mg/dL  CBC  Result Value Ref Range   WBC 5.5 4.0 - 10.5 K/uL   RBC 4.06 (L) 4.22 - 5.81 Mil/uL   Platelets 264.0 150.0 - 400.0 K/uL   Hemoglobin 14.1 13.0 - 17.0 g/dL   HCT 98.1 19.1 - 47.8 %   MCV 100.1 (H) 78.0 - 100.0 fl   MCHC 34.5 30.0 - 36.0 g/dL   RDW 29.5 62.1 - 30.8 %  Lipid panel  Result Value Ref Range   Cholesterol 238 (H) 0 - 200 mg/dL   Triglycerides 657.8 (H) 0.0 - 149.0 mg/dL   HDL 46.96 >29.52 mg/dL   VLDL 84.1 (H) 0.0 -  40.0 mg/dL   Total CHOL/HDL Ratio 5    NonHDL  188.19   TSH  Result Value Ref Range   TSH 3.20 0.35 - 5.50 uIU/mL  Vitamin D, 25-hydroxy  Result Value Ref Range   VITD 24.18 (L) 30.00 - 100.00 ng/mL  LDL cholesterol, direct  Result Value Ref Range   Direct LDL 132.0 mg/dL    Assessment and Plan  Acute cervical radiculopathy Assessment & Plan: Acute onset, severe.  Symptoms most consistent with right cervical radiculopathy from possible herniated disc.  Patient does have vertebral pain and given pain is severe we will evaluate with an x-ray of cervical spine. Treat with cervical collar, prednisone taper and tramadol 50 mg 3 times daily as needed for pain. 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  Return and ER precautions provided  Orders: -     DG Cervical Spine Complete; Future  Other orders -     predniSONE; 3 tabs by mouth daily x 3 days, then 2 tabs by mouth daily x 2 days then 1 tab by mouth daily x 2 days  Dispense: 15 tablet; Refill: 0 -     traMADol HCl; Take 1 tablet (50 mg total) by mouth every 8 (eight) hours as needed for up to 15 days.  Dispense: 15 tablet; Refill: 0    No follow-ups on file.   Kerby Nora, MD

## 2023-04-10 NOTE — Patient Instructions (Signed)
  Upper body lifting.  Consider wearing cervical collar.  We will call with X-ray results.   Complete prednisone taper. Can use tramadol for breakthrough pain 1 tablet as needed 3 times daily.  Call if tramadol 50 mg is not helping with pain.

## 2023-04-10 NOTE — Assessment & Plan Note (Signed)
Acute onset, severe.  Symptoms most consistent with right cervical radiculopathy from possible herniated disc.  Patient does have vertebral pain and given pain is severe we will evaluate with an x-ray of cervical spine. Treat with cervical collar, prednisone taper and tramadol 50 mg 3 times daily as needed for pain. 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  Return and ER precautions provided

## 2023-04-22 ENCOUNTER — Telehealth: Payer: Self-pay | Admitting: Family Medicine

## 2023-04-22 DIAGNOSIS — M5412 Radiculopathy, cervical region: Secondary | ICD-10-CM

## 2023-04-22 NOTE — Telephone Encounter (Signed)
Called patient reviewed all information and repeated back to me. Will call if any questions.  Will call office if not received call in 2 weeks to set up appointment. He will let us know if any changes in symptoms before appointment.

## 2023-04-22 NOTE — Telephone Encounter (Signed)
Patient is requesting referral to [facility]

## 2023-04-22 NOTE — Telephone Encounter (Signed)
   Reason for Referral Request: Pain in R shoulder, thinking it may be a pinched nerve   Has patient been seen PCP for this complaint?  No,  please schedule patient for appointment for complaint.  Patient scheduled on:   Yes, please find out following information.  Referral for which specialty: Neurology   Preferred office/provider: [facility]    Patient stated he is still having pain again, states he has completed prednisone as directed

## 2023-04-22 NOTE — Addendum Note (Signed)
Addended by: Kerby Nora E on: 04/22/2023 04:56 PM   Modules accepted: Orders

## 2023-04-22 NOTE — Telephone Encounter (Signed)
Let pt know referral placed

## 2023-04-29 ENCOUNTER — Encounter: Payer: Self-pay | Admitting: *Deleted

## 2023-07-02 DIAGNOSIS — N529 Male erectile dysfunction, unspecified: Secondary | ICD-10-CM | POA: Diagnosis not present

## 2023-07-02 DIAGNOSIS — R030 Elevated blood-pressure reading, without diagnosis of hypertension: Secondary | ICD-10-CM | POA: Diagnosis not present

## 2023-07-02 DIAGNOSIS — F4310 Post-traumatic stress disorder, unspecified: Secondary | ICD-10-CM | POA: Diagnosis not present

## 2023-08-22 ENCOUNTER — Ambulatory Visit: Payer: BC Managed Care – PPO | Admitting: Family Medicine

## 2023-08-26 ENCOUNTER — Encounter: Payer: Self-pay | Admitting: Family Medicine

## 2023-08-26 ENCOUNTER — Ambulatory Visit: Payer: BC Managed Care – PPO | Admitting: Family Medicine

## 2023-08-26 VITALS — BP 124/76 | HR 70 | Temp 98.1°F | Ht 75.25 in | Wt 237.1 lb

## 2023-08-26 DIAGNOSIS — R21 Rash and other nonspecific skin eruption: Secondary | ICD-10-CM | POA: Insufficient documentation

## 2023-08-26 DIAGNOSIS — Z3009 Encounter for other general counseling and advice on contraception: Secondary | ICD-10-CM | POA: Diagnosis not present

## 2023-08-26 DIAGNOSIS — E039 Hypothyroidism, unspecified: Secondary | ICD-10-CM
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
## 2023-08-26 NOTE — Assessment & Plan Note (Signed)
Possible early tinea corporis (ringworm) trial OTC clotrimazole. Update if spreading or not improving.

## 2023-08-26 NOTE — Patient Instructions (Addendum)
We will refer you to [facility] in [facility]  May try clotrimazole to spot on skin. Let us know if not improved with that.  Good to see you today.

## 2023-08-26 NOTE — Assessment & Plan Note (Signed)
On Synthroid daily, this is followed by [facility].

## 2023-08-26 NOTE — Assessment & Plan Note (Addendum)
Healthy 47 yo. Desires permanent sterilization.  Refer to [facility] for evaluation.

## 2023-10-01 ENCOUNTER — Ambulatory Visit: Payer: Self-pay | Admitting: Clinical

## 2023-10-10 DIAGNOSIS — Z3009 Encounter for other general counseling and advice on contraception: Secondary | ICD-10-CM | POA: Diagnosis not present

## 2023-11-03 DIAGNOSIS — Z302 Encounter for sterilization: Secondary | ICD-10-CM | POA: Diagnosis not present

## 2024-02-06 ENCOUNTER — Ambulatory Visit: Admitting: Family Medicine

## 2024-02-06 ENCOUNTER — Encounter: Payer: Self-pay | Admitting: Family Medicine

## 2024-02-06 ENCOUNTER — Ambulatory Visit (INDEPENDENT_AMBULATORY_CARE_PROVIDER_SITE_OTHER)
Admission: RE | Admit: 2024-02-06 | Discharge: 2024-02-06 | Disposition: A | Discharge status: Home / Self Care | Source: Ambulatory Visit | Attending: Family Medicine | Admitting: Family Medicine

## 2024-02-06 VITALS — BP 102/78 | HR 100 | Temp 98.6°F | Ht 75.25 in | Wt 241.5 lb

## 2024-02-06 DIAGNOSIS — M542 Cervicalgia: Secondary | ICD-10-CM | POA: Diagnosis not present

## 2024-02-06 DIAGNOSIS — F4310 Post-traumatic stress disorder, unspecified: Secondary | ICD-10-CM

## 2024-02-06 DIAGNOSIS — M5412 Radiculopathy, cervical region: Secondary | ICD-10-CM | POA: Diagnosis not present

## 2024-02-06 DIAGNOSIS — M4802 Spinal stenosis, cervical region: Secondary | ICD-10-CM | POA: Diagnosis not present

## 2024-02-06 DIAGNOSIS — M47812 Spondylosis without myelopathy or radiculopathy, cervical region: Secondary | ICD-10-CM | POA: Diagnosis not present

## 2024-02-06 MED ORDER — CYCLOBENZAPRINE HCL 10 MG PO TABS: 10.0000 mg | ORAL_TABLET | Freq: Three times a day (TID) | ORAL | 0 refills | Status: DC | PRN

## 2024-02-06 MED ORDER — TRAMADOL HCL 50 MG PO TABS: 50.0000 mg | ORAL_TABLET | Freq: Three times a day (TID) | ORAL | 0 refills | Status: DC | PRN

## 2024-02-06 MED ORDER — PREDNISONE 20 MG PO TABS: ORAL_TABLET | ORAL | 0 refills | Status: DC
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
## 2024-02-10 ENCOUNTER — Encounter: Payer: Self-pay | Admitting: Family Medicine

## 2024-02-13 ENCOUNTER — Telehealth: Payer: Self-pay | Admitting: *Deleted

## 2024-02-13 NOTE — Telephone Encounter (Signed)
 X-rays results discussed with Eddie via telephone.  See result note.

## 2024-02-18 ENCOUNTER — Telehealth: Payer: Self-pay | Admitting: Family Medicine

## 2024-02-18 NOTE — Telephone Encounter (Signed)
 Copied from CRM [id number]. Topic: General - Other >> Feb 18, 2024  2:07 PM Kathryne Eriksson wrote: Reason for CRM: Referral Request >> Feb 18, 2024  2:09 PM Kathryne Eriksson wrote: Patient is requesting to receive a referral to the Neurologist. [facility].

## 2024-02-19 ENCOUNTER — Other Ambulatory Visit: Payer: Self-pay | Admitting: Family Medicine

## 2024-02-19 DIAGNOSIS — M5412 Radiculopathy, cervical region: Secondary | ICD-10-CM

## 2024-02-19 NOTE — Telephone Encounter (Signed)
 Referral already placed.

## 2024-02-25 ENCOUNTER — Ambulatory Visit: Admitting: Licensed Clinical Social Worker

## 2024-03-03 ENCOUNTER — Ambulatory Visit (INDEPENDENT_AMBULATORY_CARE_PROVIDER_SITE_OTHER): Admitting: Psychology

## 2024-03-03 DIAGNOSIS — F4310 Post-traumatic stress disorder, unspecified: Secondary | ICD-10-CM

## 2024-03-03 DIAGNOSIS — F411 Generalized anxiety disorder: Secondary | ICD-10-CM | POA: Diagnosis not present

## 2024-03-03 DIAGNOSIS — F321 Major depressive disorder, single episode, moderate: Secondary | ICD-10-CM
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
## 2024-03-10 MED ORDER — CYCLOBENZAPRINE HCL 10 MG PO TABS: 10.0000 mg | ORAL_TABLET | Freq: Three times a day (TID) | ORAL | 0 refills | Status: AC | PRN

## 2024-03-10 MED ORDER — TRAMADOL HCL 50 MG PO TABS: 50.0000 mg | ORAL_TABLET | Freq: Three times a day (TID) | ORAL | 0 refills | Status: AC | PRN
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
## 2024-03-15 ENCOUNTER — Ambulatory Visit (INDEPENDENT_AMBULATORY_CARE_PROVIDER_SITE_OTHER): Admitting: Psychology

## 2024-03-15 DIAGNOSIS — F4310 Post-traumatic stress disorder, unspecified: Secondary | ICD-10-CM

## 2024-03-15 DIAGNOSIS — F411 Generalized anxiety disorder: Secondary | ICD-10-CM

## 2024-03-15 DIAGNOSIS — F321 Major depressive disorder, single episode, moderate: Secondary | ICD-10-CM | POA: Diagnosis not present
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
## 2024-03-16 ENCOUNTER — Ambulatory Visit: Admitting: Licensed Clinical Social Worker

## 2024-03-22 DIAGNOSIS — Z6830 Body mass index (BMI) 30.0-30.9, adult: Secondary | ICD-10-CM | POA: Diagnosis not present

## 2024-03-22 DIAGNOSIS — M47812 Spondylosis without myelopathy or radiculopathy, cervical region: Secondary | ICD-10-CM | POA: Diagnosis not present

## 2024-03-23 ENCOUNTER — Other Ambulatory Visit: Payer: Self-pay | Admitting: Neurosurgery

## 2024-03-23 DIAGNOSIS — M47812 Spondylosis without myelopathy or radiculopathy, cervical region: Secondary | ICD-10-CM

## 2024-03-24 ENCOUNTER — Telehealth: Payer: Self-pay | Admitting: Family Medicine

## 2024-03-24 ENCOUNTER — Encounter: Payer: Self-pay | Admitting: Neurosurgery

## 2024-03-24 DIAGNOSIS — M5412 Radiculopathy, cervical region: Secondary | ICD-10-CM

## 2024-03-24 NOTE — Telephone Encounter (Signed)
 Copied from CRM [id number]. Topic: Clinical - Medication Refill >> Mar 24, 2024  2:38 PM Robert Mathews wrote: Medication: TraMADol  (ULTRAM ) 50 MG tablet     Has the patient contacted their pharmacy? No (Agent: If no, request that the patient contact the pharmacy for the refill. If patient does not wish to contact the pharmacy document the reason why and proceed with request.) (Agent: If yes, when and what did the pharmacy advise?)  This is the patient's preferred pharmacy:  [id number] [place] [facility] - [address] [place] [address] Phone: [phone] Fax: [phone]   Is this the correct pharmacy for this prescription? Yes If no, delete pharmacy and type the correct one.   Has the prescription been filled recently? No  Is the patient out of the medication? Yes  Has the patient been seen for an appointment in the last year OR does the patient have an upcoming appointment? Yes  Can we respond through MyChart? No  Agent: Please be advised that Rx refills may take up to 3 business days. We ask that you follow-up with your pharmacy.

## 2024-03-24 NOTE — Telephone Encounter (Signed)
 LM for pt to return call.  Pt needs to be scheduled for a TOC appointment.

## 2024-03-24 NOTE — Telephone Encounter (Signed)
 Last Fill: 02/06/24  Last OV: 02/06/24 Next OV: None Scheduled  Routing to provider for review/authorization.

## 2024-03-24 NOTE — Telephone Encounter (Signed)
 Copied from CRM [id number]. Topic: Clinical - Medication Refill >> Mar 24, 2024  2:19 PM Rosheka N wrote: Medication: traMADol  (ULTRAM ) 50 MG tablet  Has the patient contacted their pharmacy? No (Agent: If no, request that the patient contact the pharmacy for the refill. If patient does not wish to contact the pharmacy document the reason why and proceed with request.) (Agent: If yes, when and what did the pharmacy advise?)  This is the patient's preferred pharmacy: Yes [id number] [place] [address] [place] [address] Phone: [phone] Fax: [phone]   Is this the correct pharmacy for this prescription? Yes If no, delete pharmacy and type the correct one.   Has the prescription been filled recently? No  Is the patient out of the medication? Yes  Has the patient been seen for an appointment in the last year OR does the patient have an upcoming appointment? Yes  Can we respond through MyChart? No  Agent: Please be advised that Rx refills may take up to 3 business days. We ask that you follow-up with your pharmacy.

## 2024-03-24 NOTE — Telephone Encounter (Signed)
 Spoke with pt. He is aware that he needs to establish with a new provider in the office as Dr. Aletha Hutching is no longer here. Pt has been scheduled with [facility] for a TOC on 04/19/24 at 0940. Pt would like to know if Dr. Cherlyn Cornet would refill the Tramadol  in the meantime. This was last given to him on 02/06/24 #15.

## 2024-03-25 MED ORDER — TRAMADOL HCL 50 MG PO TABS: 50.0000 mg | ORAL_TABLET | Freq: Every day | ORAL | 0 refills | Status: AC

## 2024-03-25 NOTE — Addendum Note (Signed)
 Addended by: Wyn Heater on: 03/25/2024 02:18 PM   Modules accepted: Orders

## 2024-03-25 NOTE — Telephone Encounter (Signed)
 Copied from CRM [id number]. Topic: Clinical - Prescription Issue >> Mar 25, 2024 11:39 AM Artemio Larry wrote: Reason for CRM: Patient requested phone call back from [place] regarding the Tramadol  at [phone].

## 2024-03-25 NOTE — Telephone Encounter (Signed)
Tramadol is not on current medication list.

## 2024-03-25 NOTE — Telephone Encounter (Signed)
 Given I saw the patient for this issue earlier in March I will refill the tramadol  as requested until he can establish with a new provider in the office.  Please let me know how often he is currently using tramadol  for pain and I will send in enough to last 1 month.

## 2024-03-25 NOTE — Telephone Encounter (Signed)
 Robert Mathews notified as instructed by telephone. He is taking it once a day.  He states he is waiting on getting his MRI approved and then will probably be doing the epidural injection so hopefully he won't need to Tramadol  after that. Please sign Rx below.

## 2024-03-28 ENCOUNTER — Ambulatory Visit
Admission: RE | Admit: 2024-03-28 | Discharge: 2024-03-28 | Disposition: A | Discharge status: Home / Self Care | Source: Ambulatory Visit | Attending: Neurosurgery | Admitting: Neurosurgery

## 2024-03-28 DIAGNOSIS — M47812 Spondylosis without myelopathy or radiculopathy, cervical region: Secondary | ICD-10-CM

## 2024-03-31 ENCOUNTER — Ambulatory Visit (INDEPENDENT_AMBULATORY_CARE_PROVIDER_SITE_OTHER): Admitting: Psychology

## 2024-03-31 DIAGNOSIS — F4310 Post-traumatic stress disorder, unspecified: Secondary | ICD-10-CM | POA: Diagnosis not present

## 2024-03-31 DIAGNOSIS — F321 Major depressive disorder, single episode, moderate: Secondary | ICD-10-CM

## 2024-03-31 DIAGNOSIS — F411 Generalized anxiety disorder: Secondary | ICD-10-CM

## 2024-03-31 NOTE — Progress Notes (Unsigned)
 [facility] Counselor/Therapist Progress Note  Patient ID: Robert Mathews, MRN: [id number]    Date: 03/31/24  Time Spent: 9:00 am - 9:55 am : 55 minutes   Treatment Type: Individual Therapy.  Reported Symptoms: Anxiety: Feeling nervous, anxious or on edge, not being able to stop or control worrying, trouble relaxing, being so restless that it is hard to sit still, becoming easily annoyed or irritable.(Status: maintained) Depression: little interest or pleasure in doing things, feeling down, depressed, or hopeless, trouble falling or staying asleep, or sleeping too much, feeling tired or having little energy.Trouble concentrating on things, such as reading the newspaper or watching television. (Status: maintained)   Mental Status Exam: Appearance:  Casual     Behavior: Appropriate  Motor: Normal  Speech/Language:  Normal Rate  Affect: Appropriate  Mood: normal  Thought process: normal  Thought content:   WNL  Sensory/Perceptual disturbances:   WNL  Orientation: oriented to person, place, and time/date  Attention: Good  Concentration: Good  Memory: WNL  Fund of knowledge:  Good  Insight:   Good  Judgment:  Good  Impulse Control: Good   Risk Assessment: Danger to Self:  No Self-injurious Behavior: No Danger to Others: No Duty to Warn:no Physical Aggression / Violence:No  Access to Firearms a concern: No  Gang Involvement:No   Subjective:   Robert Mathews participated from home, via video, and consented to treatment. I discussed the limitations of evaluation and management by telemedicine and the availability of in person appointments. The patient expressed understanding and agreed to proceed.  Therapist participated from home office.  Robert Mathews reviewed the events of the past week.   Patient was wearing a "Don't worry, I'm from [employer]", that had a cat on his T-shirt.   Patient expressed frustration with himself because he couldn't "rub dirt in it" when he was frustrated  with being stuck in traffic yesterday. He was exhausted and his evening and morning routines were changed. Patient admitted that his response was definitely better than it would have been in the past. We discussed the fact that patient sets the bar very high for himself and has difficulty cutting himself any slack.   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   Interventions: Cognitive Behavioral Therapy  Diagnosis: Current moderate episode of major depressive disorder without prior episode (HCC) [F32.1], Posttraumatic stress disorder [F43.10], Generalized Anxiety Disorder (GAD) 41.1   Psychiatric Treatment:  "Yes , In Oct. 2024, there was this inpatient thing at the [place], but I don't remember too much about that".   Treatment Plan:  Client Abilities/Strengths Robert Mathews is determined, a self advocate, and motivated to learn more about managing stress in his life.   Support System: Daughter, and friends in [place] since 2007  Client Treatment Preferences OPT  Client Statement of Needs Robert Mathews would like to get back to where he was one month ago, get his quality of life back, manage his stress more effectively.    Treatment Level Biweekly/Weekly  Symptoms  Anxiety: Feeling nervous, anxious or on edge, not being able to stop or control worrying, trouble relaxing, being so restless that it is hard to sit still, becoming easily annoyed or irritable.(Status: maintained) Depression: little interest or pleasure in doing things, feeling down, depressed, or  hopeless, trouble falling or staying asleep, or sleeping too much, feeling tired or having little energy.Trouble concentrating on things, such as reading the newspaper or watching television. (Status: maintained)   Goals:    Robert Mathews experiences symptoms of depression and anxiety.   Target Date: 03/03/25 Frequency: Weekly/Biweekly  Progress: 0 Modality: individual    Therapist will provide referrals for additional resources as appropriate.  Therapist will provide psycho-education regarding Robert Mathews's diagnosis and corresponding treatment approaches and interventions. Robert Mathews will support the patient's ability to achieve the goals identified. will employ CBT, BA, Problem-solving, Solution Focused, Mindfulness,  coping skills, & other evidenced-based practices will be used to promote progress towards healthy functioning to help manage decrease symptoms associated with their diagnosis.   Reduce overall level, frequency, and intensity of the feelings of depression, anxiety and panic evidenced by decreased overall symptoms from 6 to 7 days/week to 0 to 1 days/week per client report for at least 3 consecutive months. Verbally express understanding of the relationship between feelings of depression and anxiety and their impact on thinking patterns and behaviors. Verbalize an understanding of the role that distorted thinking plays in creating fears, excessive worry, and ruminations.  Robert Mathews participated in the creation of the treatment plan)    Robert Mathews

## 2024-04-07 ENCOUNTER — Encounter: Payer: Self-pay | Admitting: Psychology

## 2024-04-14 ENCOUNTER — Ambulatory Visit (INDEPENDENT_AMBULATORY_CARE_PROVIDER_SITE_OTHER): Admitting: Psychology

## 2024-04-14 DIAGNOSIS — F4310 Post-traumatic stress disorder, unspecified: Secondary | ICD-10-CM | POA: Diagnosis not present

## 2024-04-14 DIAGNOSIS — F411 Generalized anxiety disorder: Secondary | ICD-10-CM

## 2024-04-14 NOTE — Progress Notes (Signed)
 [facility] Counselor/Therapist Progress Note  Patient ID: Robert Mathews, MRN: [id number]    Date: 04/14/24  Time Spent: 12:00 pm - 12:54 pm : 54 Minutes  Treatment Type: Individual Therapy.  Reported Symptoms:  Anxiety: Feeling nervous, anxious or on edge, not being able to stop or control worrying, trouble relaxing, being so restless that it is hard to sit still, becoming easily annoyed or irritable. Depression: little interest or pleasure in doing things, feeling down, depressed, or hopeless, trouble falling or staying asleep, or sleeping too much, feeling tired or having little energy.Trouble concentrating on things, such as reading the newspaper or watching television.   Mental Status Exam: Appearance:  Well Groomed     Behavior: Appropriate  Motor: Normal  Speech/Language:  Normal Rate  Affect: Appropriate  Mood: normal  Thought process: normal  Thought content:   WNL  Sensory/Perceptual disturbances:   WNL  Orientation: oriented to person, place, and time/date  Attention: Good  Concentration: Good  Memory: WNL  Fund of knowledge:  Good  Insight:   Good  Judgment:  Good  Impulse Control: Good   Risk Assessment: Danger to Self:  No Self-injurious Behavior: No Danger to Others: No Duty to Warn:no Physical Aggression / Violence:No  Access to Firearms a concern: No  Gang Involvement:No   Subjective:   [name] participated from home, via video, and consented to treatment. I discussed the limitations of evaluation and management by telemedicine and the availability of in person appointments. The patient expressed understanding and agreed to proceed.  Therapist participated from home office.  [place] reviewed the events of the past week.   Patient had just returned from being in [facility] for 10 days and that went well. Wasn't sure what work will look like when he returns to work tomorrow. Patient had "a few bouts of anxiety" while in [place], but he dealt with it as he normally  do. Politely excuse himself from the situation-get the keys from his friend and wait in the car with the a/c on. Patient started back to school 2 weeks ago for his MA degree. Finishing school in 2027. Started thinking about the possibility of moving back to [facility]. It's Ray not Rich who is his buddy (rifleman in [place]). He's been in [place] for 18 years.  Goals from 4/28: Getting back to where I was a month ago--  [place] vac[place] helped achieve that, not totally but made a dent  Getting quality of life back- Plan for reentry into work, maybe go into work a few hours before it hits the fam.  Manage stress more effectively--physical activity had been his go-to for managing stress.  Early June appt. Re: MRI, plans to get back to physical exercise.   Interventions: Cognitive Behavioral Therapy  Diagnosis:  Posttraumatic stress disorder [F43.10],  Generalized Anxiety Disorder (GAD) 41.1,    Psychiatric Treatment:  "Yes , In Oct. 2024, there was this inpatient thing at the [place], but I don't remember too much about that".   Treatment Plan:  Client Abilities/Strengths Lieutenant is determined, a self advocate, and motivated to learn more about managing stress in his life.    Support System: Daughter, and friends in [place] since 2007   Client Treatment Preferences OPT  Client Statement of Needs Ezra would like to get back to where he was one month ago, get his quality of life back, manage his stress more effectively.    Treatment Level Biweekly  Symptoms Anxiety: Feeling nervous, anxious or on edge, not  being able to stop or control worrying, trouble relaxing, being so restless that it is hard to sit still, becoming easily annoyed or irritable.(Status: maintained) Depression: little interest or pleasure in doing things, feeling down, depressed, or hopeless, trouble falling or staying asleep, or sleeping too much, feeling tired or having little energy.Trouble concentrating on things, such as reading the  newspaper or watching television. (Status: maintained)  Goals:   Draper experiences symptoms of depression and anxiety.  Target Date: 03/03/25 Frequency: Biweekly  Progress: 0 Modality: individual    Therapist will provide referrals for additional resources as appropriate.  Therapist will provide psycho-education regarding Eulis's diagnosis and corresponding treatment approaches and interventions. [name] will support the patient's ability to achieve the goals identified. will employ CBT, BA, Problem-solving, Solution Focused, Mindfulness,  coping skills, & other evidenced-based practices will be used to promote progress towards healthy functioning to help manage decrease symptoms associated with their diagnosis.   Reduce overall level, frequency, and intensity of the feelings of depression, anxiety and panic evidenced by decreased overall symptoms from 6 to 7 days/week to 0 to 1 days/week per client report for at least 3 consecutive months. Verbally express understanding of the relationship between feelings of depression and anxiety and their impact on thinking patterns and behaviors. Verbalize an understanding of the role that distorted thinking plays in creating fears, excessive worry, and ruminations.  [place] participated in the creation of the treatment plan)    [name]

## 2024-04-19 ENCOUNTER — Ambulatory Visit (INDEPENDENT_AMBULATORY_CARE_PROVIDER_SITE_OTHER): Admitting: General Practice

## 2024-04-19 ENCOUNTER — Encounter: Payer: Self-pay | Admitting: General Practice

## 2024-04-19 ENCOUNTER — Ambulatory Visit: Payer: Self-pay | Admitting: General Practice

## 2024-04-19 VITALS — BP 124/80 | HR 89 | Temp 97.2°F | Ht 75.1 in | Wt 247.0 lb

## 2024-04-19 DIAGNOSIS — M5412 Radiculopathy, cervical region: Secondary | ICD-10-CM | POA: Diagnosis not present

## 2024-04-19 DIAGNOSIS — I10 Essential (primary) hypertension: Secondary | ICD-10-CM | POA: Diagnosis not present

## 2024-04-19 DIAGNOSIS — E039 Hypothyroidism, unspecified: Secondary | ICD-10-CM

## 2024-04-19 DIAGNOSIS — F4310 Post-traumatic stress disorder, unspecified: Secondary | ICD-10-CM

## 2024-04-19 DIAGNOSIS — Z7689 Persons encountering health services in other specified circumstances: Secondary | ICD-10-CM | POA: Diagnosis not present

## 2024-04-19 DIAGNOSIS — E782 Mixed hyperlipidemia: Secondary | ICD-10-CM | POA: Diagnosis not present

## 2024-04-19 DIAGNOSIS — F321 Major depressive disorder, single episode, moderate: Secondary | ICD-10-CM

## 2024-04-19 DIAGNOSIS — E559 Vitamin D deficiency, unspecified: Secondary | ICD-10-CM | POA: Diagnosis not present

## 2024-04-19 DIAGNOSIS — Z113 Encounter for screening for infections with a predominantly sexual mode of transmission: Secondary | ICD-10-CM | POA: Diagnosis not present

## 2024-04-19 DIAGNOSIS — F411 Generalized anxiety disorder: Secondary | ICD-10-CM

## 2024-04-19 LAB — CBC
HCT: 41 % (ref 39.0–52.0)
Hemoglobin: 14.1 g/dL (ref 13.0–17.0)
MCHC: 34.3 g/dL (ref 30.0–36.0)
MCV: 99.2 fl (ref 78.0–100.0)
Platelets: 265 10*3/uL (ref 150.0–400.0)
RBC: 4.14 Mil/uL — ABNORMAL LOW (ref 4.22–5.81)
RDW: 12.9 % (ref 11.5–15.5)
WBC: 5.9 10*3/uL (ref 4.0–10.5)

## 2024-04-19 LAB — COMPREHENSIVE METABOLIC PANEL WITH GFR
ALT: 24 U/L (ref 0–53)
AST: 19 U/L (ref 0–37)
Albumin: 4.5 g/dL (ref 3.5–5.2)
Alkaline Phosphatase: 70 U/L (ref 39–117)
BUN: 25 mg/dL — ABNORMAL HIGH (ref 6–23)
CO2: 29 meq/L (ref 19–32)
Calcium: 10.9 mg/dL — ABNORMAL HIGH (ref 8.4–10.5)
Chloride: 99 meq/L (ref 96–112)
Creatinine, Ser: 1.31 mg/dL (ref 0.40–1.50)
GFR: 64.01 mL/min (ref >60.00)
Glucose, Bld: 92 mg/dL (ref 70–99)
Potassium: 4.3 meq/L (ref 3.5–5.1)
Sodium: 137 meq/L (ref 135–145)
Total Bilirubin: 0.4 mg/dL (ref 0.2–1.2)
Total Protein: 6.8 g/dL (ref 6.0–8.3)

## 2024-04-19 LAB — VITAMIN D 25 HYDROXY (VIT D DEFICIENCY, FRACTURES): VITD: 27.26 ng/mL — ABNORMAL LOW (ref 30.00–100.00)

## 2024-04-19 LAB — LIPID PANEL
Cholesterol: 281 mg/dL — ABNORMAL HIGH (ref 0–200)
HDL: 43.8 mg/dL (ref >39.00)
NonHDL: 236.99
Total CHOL/HDL Ratio: 6
Triglycerides: 782 mg/dL — ABNORMAL HIGH (ref 0.0–149.0)
VLDL: 156.4 mg/dL — ABNORMAL HIGH (ref 0.0–40.0)

## 2024-04-19 LAB — TSH: TSH: 0.54 u[IU]/mL (ref 0.35–5.50)

## 2024-04-19 LAB — LDL CHOLESTEROL, DIRECT: Direct LDL: 120 mg/dL

## 2024-04-19 NOTE — Assessment & Plan Note (Signed)
 Patient requests routine STI testing.

## 2024-04-19 NOTE — Assessment & Plan Note (Addendum)
 Controlled.  Followed by [facility].  Per patient, scheduled for mri f/u in two days.   Continue Tramadol  50 mg once daily. He will update when he needs refill. Discussed using sparingly.  He is hopeful that he would get an epidural to avoid taking this daily. Reviewed PDMP.   Will continue to monitor.

## 2024-04-19 NOTE — Assessment & Plan Note (Signed)
 EMR reviewed briefly.

## 2024-04-19 NOTE — Progress Notes (Signed)
 New Patient Office Visit  Subjective    Patient ID: Robert Mathews, male    DOB: Feb 15, 1976  Age: 48 y.o. MRN: [id number]  CC:  Chief Complaint  Patient presents with   Transitions Of Care    Needs refill on tramadol     HPI Robert Mathews is a 48 y.o. male presents to establish care.  Previous PCP/physical/labs: Robert Canterbury, MD.   GAD/depression: followed by psychiatrist at the [facility]. currently managed on Wellbutrin  300 mg once daily and Duloxetine 40 mg once daily. [facility] and is doing well.   [facility]: [facility] He has been currently managed on synthroid  150 mcg once daily.  He was recently was switched to levothyroxine . Has not had testing done since the switch two months ago.  Cervical radiculopathy: was seen by Dr. Cherlyn Mathews. Has established with neurosurgery. Has an MRI f/u in 04/21/24. He will be told after that if he qualifies for the epidural or not. He is currently managed on Tramadol  daily for the pain. He does not need a refill today but will need one if there is a break between the injection and the last day of his current prescription.   Colonoscopy- [facility] a [facility]ard within the [facility] He will send in via mychart.   Outpatient Encounter Medications as of 04/19/2024  Medication Sig   buPROPion  (WELLBUTRIN  XL) 300 MG 24 hr tablet Take 300 mg by mouth daily.   Cholecalciferol 100 MCG (4000 UT) CAPS Take 1 capsule by mouth daily.   cyclobenzaprine  (FLEXERIL ) 10 MG tablet Take 1 tablet (10 mg total) by mouth 3 (three) times daily as needed for muscle spasms.   DULoxetine (CYMBALTA) 20 MG capsule Take 40 mg by mouth daily.   meloxicam  (MOBIC ) 15 MG tablet Take 1 tablet (15 mg total) by mouth daily as needed for pain.   SYNTHROID  150 MCG tablet Take 1 tablet (150 mcg total) by mouth daily before breakfast.   traMADol  (ULTRAM ) 50 MG tablet Take 1 tablet (50 mg total) by mouth daily.   [DISCONTINUED] predniSONE  (DELTASONE ) 20 MG  tablet 3 tabs by mouth daily x 3 days, then 2 tabs by mouth daily x 2 days then 1 tab by mouth daily x 2 days   No facility-administered encounter medications on file as of 04/19/2024.    Past Medical History:  Diagnosis Date   Arthritis    right knee   Chronic back pain    Depression    Dermatitis due to plant    Hypothyroidism    Kidney stones    Neuropathy    both legs   PNA (pneumonia)    2 times   PTSD (post-traumatic stress disorder)    Radius fracture    TBI (traumatic brain injury) [facility])     Past Surgical History:  Procedure Laterality Date   arm surgery     put under anesthesia for resetting of fracture   CARPAL TUNNEL RELEASE Right 09/2015   mole removed     REFRACTIVE SURGERY Bilateral    SPINAL CORD STIMULATOR INSERTION N/A 10/31/2015   Procedure: SPINAL CORD STIMULATOR INSERTION;  Surgeon: Robert Sells, MD;  Location: [facility];  Service: Neurosurgery;  Laterality: N/A;  LUMBAR SPINAL CORD STIMULATOR INSERTION    Family History  Problem Relation Age of Onset   Alcohol abuse Father    Hypertension Father    Heart disease Father    Emphysema Father  Arthritis Father    Depression Father    Drug abuse Father    Diabetes Mother    Arthritis Mother    Diabetes Maternal Grandmother    Heart disease Maternal Grandfather        cabg   Heart attack Maternal Grandfather 27   Heart disease Paternal Grandfather    Diabetes Paternal Grandfather    Learning disabilities Sister    Arthritis/Rheumatoid Sister     Social History   Socioeconomic History   Marital status: Married    Spouse name: Robert Mathews   Number of children: 1   Years of education: Bachelor's degree   Highest education level: Not on file  Occupational History   Occupation: SECURITY  Tobacco Use   Smoking status: Former    Current packs/day: 0.00    Types: Cigarettes    Start date: 10/24/1994    Quit date: 10/24/2004    Years since quitting: 19.4   Smokeless tobacco: Former    Types:  Chew    Quit date: 10/24/2004  Vaping Use   Vaping status: Never Used  Substance and Sexual Activity   Alcohol use: Yes    Comment: weekends, 6-8 servings per week   Drug use: No   Sexual activity: Yes    Birth control/protection: Post-menopausal  Other Topics Concern   Not on file  Social History Narrative   07/17/20   From: [place] moved [place]   Living: with wife, Robert Mathews (2006) and daughter   Work: Radiographer, therapeutic in gene therapy      Family: daughter Robert Mathews (2002) - at [facility]       Enjoys: hunt, fish, rebuild motorcycles, reading      Exercise: farm-work - [place]   Diet: not great, usually eats a healthy breakfast and lunch leftovers, does drink soda      Safety   Seat belts: Yes    Guns: Yes  and secure   Safe in relationships: Yes    Social Drivers of Corporate investment banker Strain: Not on file  Food Insecurity: Not on file  Transportation Needs: Not on file  Physical Activity: Not on file  Stress: Not on file  Social Connections: Not on file  Intimate Partner Violence: Not on file    Review of Systems  Constitutional:  Negative for chills and fever.  Respiratory:  Negative for shortness of breath.   Cardiovascular:  Negative for chest pain.  Gastrointestinal:  Negative for abdominal pain, constipation, diarrhea, heartburn, nausea and vomiting.  Genitourinary:  Negative for dysuria, frequency and urgency.  Neurological:  Negative for dizziness and headaches.  Endo/Heme/Allergies:  Negative for polydipsia.  Psychiatric/Behavioral:  Negative for depression and suicidal ideas. The patient is not nervous/anxious.         Objective    BP 124/80 (BP Location: Left Arm, Patient Position: Sitting, Cuff Size: Normal)   Pulse 89   Temp (!) 97.2 F (36.2 C) (Oral)   Ht 6' 3.1" (1.908 m)   Wt 247 lb (112 kg)   SpO2 96%   BMI 30.79 kg/m   Physical Exam Vitals and nursing note reviewed.  Constitutional:      Appearance: Normal  appearance.  Cardiovascular:     Rate and Rhythm: Normal rate and regular rhythm.     Pulses: Normal pulses.     Heart sounds: Normal heart sounds.  Pulmonary:     Effort: Pulmonary effort is normal.     Breath sounds: Normal breath  sounds.  Neurological:     Mental Status: He is alert and oriented to person, place, and time.  Psychiatric:        Mood and Affect: Mood normal.        Behavior: Behavior normal.        Thought Content: Thought content normal.        Judgment: Judgment normal.         Assessment & Plan:  Cervical radiculopathy Assessment & Plan: Controlled.  Followed by [facility].  Per patient, scheduled for mri f/u in two days.   Continue Tramadol  50 mg once daily. He will update when he needs refill. Discussed using sparingly.  He is hopeful that he would get an epidural to avoid taking this daily. Reviewed PDMP.   Will continue to monitor.   Establishing care with new doctor, encounter for Assessment & Plan: EMR reviewed briefly.    Essential hypertension Assessment & Plan: BP at goal. Continue off meds. Labs pending.  Orders: -     CBC -     Comprehensive metabolic panel with GFR  HYPERLIPIDEMIA Assessment & Plan: Repeat lipid panel pending.  Orders: -     Lipid panel  Acquired hypothyroidism Assessment & Plan: Currently managed on Levothyroxine  150 mcg. Recently switched from Synthroid  to levothyroxine . Has had trouble with the generic in the past. Will check TSH today to make sure he is absorbing it well. No side effects as of now.   TSH pending.  Orders: -     TSH  Vitamin D  deficiency Assessment & Plan: Repeat vitamin d  level pending.  Orders: -     VITAMIN D  25 Hydroxy (Vit-D Deficiency, Fractures)  Routine screening for STI (sexually transmitted infection) Assessment & Plan: Patient requests routine STI testing.   Orders: -     RPR -     Hepatitis C antibody -     HIV Antibody (routine testing w rflx) -      C. trachomatis/N. gonorrhoeae RNA  Current moderate episode of major depressive disorder without prior episode (HCC) Assessment & Plan: Controlled.  Continue duloxetine 40 mg once daily and Wellbutrin  300 mg once daily. Followed by psychiatry and psychologist.   Generalized anxiety disorder Assessment & Plan: Controlled. Continue Wellbutrin  300 mg once daily and duloxetine 40 mg once daily. Followed by psychiatry and psychology.   Post traumatic stress disorder (PTSD) Assessment & Plan: Controlled.  Following with psychology and psychiatry.  Continue Wellbutrin  300 mg once daily and Duloxetine 40 mg once daily.     Return in about 6 months (around 10/19/2024) for physical.   [name], NP

## 2024-04-19 NOTE — Assessment & Plan Note (Signed)
 Controlled.  Following with psychology and psychiatry.  Continue Wellbutrin  300 mg once daily and Duloxetine 40 mg once daily.

## 2024-04-19 NOTE — Assessment & Plan Note (Signed)
 Currently managed on Levothyroxine  150 mcg. Recently switched from Synthroid  to levothyroxine . Has had trouble with the generic in the past. Will check TSH today to make sure he is absorbing it well. No side effects as of now.   TSH pending.

## 2024-04-19 NOTE — Assessment & Plan Note (Signed)
 Controlled.  Continue duloxetine 40 mg once daily and Wellbutrin  300 mg once daily. Followed by psychiatry and psychologist.

## 2024-04-19 NOTE — Patient Instructions (Addendum)
 Stop by the lab prior to leaving today. I will notify you of your results once received.   Let me know when you need a refill for Tramadol .   Follow up in 6 months for a physical.   It was a pleasure meeting you!

## 2024-04-19 NOTE — Assessment & Plan Note (Signed)
 Repeat lipid panel pending.

## 2024-04-19 NOTE — Assessment & Plan Note (Signed)
 Repeat vitamin d level pending.

## 2024-04-19 NOTE — Assessment & Plan Note (Addendum)
 Controlled. Continue Wellbutrin  300 mg once daily and duloxetine 40 mg once daily. Followed by psychiatry and psychology.

## 2024-04-19 NOTE — Assessment & Plan Note (Signed)
 BP at goal. Continue off meds. Labs pending.

## 2024-04-20 LAB — RPR: RPR Ser Ql: NONREACTIVE

## 2024-04-20 LAB — HIV ANTIBODY (ROUTINE TESTING W REFLEX): HIV 1&2 Ab, 4th Generation: NONREACTIVE

## 2024-04-20 LAB — HEPATITIS C ANTIBODY: Hepatitis C Ab: NONREACTIVE

## 2024-04-20 LAB — C. TRACHOMATIS/N. GONORRHOEAE RNA
C. trachomatis RNA, TMA: NOT DETECTED
N. gonorrhoeae RNA, TMA: NOT DETECTED

## 2024-04-20 MED ORDER — VITAMIN D (ERGOCALCIFEROL) 1.25 MG (50000 UNIT) PO CAPS
50000.0000 [IU] | ORAL_CAPSULE | ORAL | 0 refills | Status: DC
Start: 2024-04-20 — End: 2024-08-13

## 2024-04-27 ENCOUNTER — Encounter: Payer: Self-pay | Admitting: General Practice

## 2024-04-27 ENCOUNTER — Telehealth (INDEPENDENT_AMBULATORY_CARE_PROVIDER_SITE_OTHER): Admitting: General Practice

## 2024-04-27 VITALS — Ht 75.25 in

## 2024-04-27 DIAGNOSIS — E782 Mixed hyperlipidemia: Secondary | ICD-10-CM

## 2024-04-27 DIAGNOSIS — M5412 Radiculopathy, cervical region: Secondary | ICD-10-CM | POA: Diagnosis not present

## 2024-04-27 DIAGNOSIS — E039 Hypothyroidism, unspecified: Secondary | ICD-10-CM

## 2024-04-27 MED ORDER — TRAMADOL HCL 50 MG PO TABS: 50.0000 mg | ORAL_TABLET | Freq: Three times a day (TID) | ORAL | 0 refills | Status: AC | PRN

## 2024-04-27 NOTE — Progress Notes (Signed)
 Virtual Visit via Video Note  I connected with Robert Mathews on 04/27/24 at 12:00 PM EDT by a video enabled telemedicine application and verified that I am speaking with the correct person using two identifiers.  Patient Location: Other:  work Dispensing optician: Office/Clinic  I discussed the limitations, risks, security, and privacy concerns of performing an evaluation and management service by video and the availability of in person appointments. I also discussed with the patient that there may be a patient responsible charge related to this service. The patient expressed understanding and agreed to proceed.  Subjective: PCP: Robert Nation, NP  Chief Complaint  Patient presents with   Neck Pain and Labs    States the neck pain is not any different. Neurosurgery is waiting on imaging report. Asking for refill of tramadol  because they will not do the refill until they get that report.    HPI  Robert Mathews is a 48 year old male with past medical history of hypothyroidism, hypertension, cervical radiculpathy, hyperlipidemia, PTSD presents today via televisit to discuss neck pain and labs.  Hyperlipidemia: Labs completed on 04/19/2024 showed elevated triglycerides in the 700s along with elevated total cholesterol and LDL.  Reports that he has not taken any medication in the past.  He has a history of elevated triglycerides in the past which he was able to control with diet, exercise and fish oil.  His diet includes bacon, sausage however he does try to make better food choices.  Avoids drinking alcohol.  He denies any chest pain, shortness of breath or difficulty breathing.  He has not been evaluated by a cardiologist in the past.  Hypothyroidism: Labs completed on 04/19/2024 show TSH level within the normal range however lower than his usual.  He is concerned as he started a different thyroid  medication in March and is worried about absorption.  He has been feeling more sluggish and tired since  he made the change of the medication.   Neck pain: Chronic and ongoing.  Patient had his MRI completed and is waiting to hear back from the neurosurgeon regarding the results of the MRI.  He is requesting a refill on tramadol  as that is the only medication that has been helping his pain.  He is hoping once the results come that he is able to get epidural block to help with his pain in the long-term.  He denies any constipation or any other abdominal concerns at this time.  ROS: Per HPI  Current Outpatient Medications:    buPROPion  (WELLBUTRIN  XL) 300 MG 24 hr tablet, Take 300 mg by mouth daily., Disp: , Rfl:    cyclobenzaprine  (FLEXERIL ) 10 MG tablet, Take 1 tablet (10 mg total) by mouth 3 (three) times daily as needed for muscle spasms., Disp: 30 tablet, Rfl: 0   DULoxetine (CYMBALTA) 20 MG capsule, Take 40 mg by mouth daily., Disp: , Rfl:    meloxicam  (MOBIC ) 15 MG tablet, Take 1 tablet (15 mg total) by mouth daily as needed for pain., Disp: , Rfl:    SYNTHROID  150 MCG tablet, Take 1 tablet (150 mcg total) by mouth daily before breakfast., Disp: 90 tablet, Rfl: 1   Vitamin D , Ergocalciferol , (DRISDOL ) 1.25 MG (50000 UNIT) CAPS capsule, Take 1 capsule (50,000 Units total) by mouth every 7 (seven) days., Disp: 12 capsule, Rfl: 0   Cholecalciferol 100 MCG (4000 UT) CAPS, Take 1 capsule by mouth daily. (Patient not taking: Reported on 04/27/2024), Disp: , Rfl:    traMADol  (ULTRAM ) 50 MG  tablet, Take 1 tablet (50 mg total) by mouth every 8 (eight) hours as needed., Disp: 15 tablet, Rfl: 0  Observations/Objective: Today's Vitals   04/27/24 0940  Height: 6' 3.25" (1.911 m)   Physical Exam Nursing note reviewed.  Constitutional:      Appearance: Normal appearance.  Eyes:     Conjunctiva/sclera: Conjunctivae normal.  Pulmonary:     Effort: Pulmonary effort is normal.  Neurological:     Mental Status: He is alert and oriented to person, place, and time.  Psychiatric:        Mood and Affect:  Mood normal.        Behavior: Behavior normal.        Thought Content: Thought content normal.        Judgment: Judgment normal.     Assessment and Plan: Acquired hypothyroidism Assessment & Plan: Discussed and reviewed labs from over the years with patient.  Continue levothyroxine  150 mcg. Repeat TSH level in 4 weeks. We discussed possible dosage change of his numbers are in the abnormal range.  Orders: -     TSH; Future  HYPERLIPIDEMIA Assessment & Plan: Uncontrolled.    Reviewed labs with patient at length, over the years. Handout provided to help lower cholesterol. Declines medication at this time. Discussed the risks of elevated cholesterol and ASCVD risk.   Will recheck lipid panel in 4 weeks.  Orders: -     Lipid panel; Future  Cervical radiculopathy Assessment & Plan: Chronic and ongoing. Followed by [facility]. Completed the MRI is waiting for results.  Reviewed PDMP. Provided refill for tramadol  50 mg every 8 hours as needed.  Discussed using sparingly. Will continue to monitor.  Orders: -     traMADol  HCl; Take 1 tablet (50 mg total) by mouth every 8 (eight) hours as needed.  Dispense: 15 tablet; Refill: 0    Follow Up Instructions: Return in about 6 months (around 10/27/2024) for office visit.; Needs lab appt for 05/17/24 and three month for vitamin d ..   I discussed the assessment and treatment plan with the patient. The patient was provided an opportunity to ask questions, and all were answered. The patient agreed with the plan and demonstrated an understanding of the instructions.   The patient was advised to call back or seek an in-person evaluation if the symptoms worsen or if the condition fails to improve as anticipated.  The above assessment and management plan was discussed with the patient. The patient verbalized understanding of and has agreed to the management plan.   Robert Nation, NP

## 2024-04-27 NOTE — Assessment & Plan Note (Signed)
 Chronic and ongoing. Followed by [facility]. Completed the MRI is waiting for results.  Reviewed PDMP. Provided refill for tramadol  50 mg every 8 hours as needed.  Discussed using sparingly. Will continue to monitor.

## 2024-04-27 NOTE — Patient Instructions (Addendum)
 Refill sent for Tramadol .   Cut back on fried greasy foods, bacon, sausage. Eat a low fat low cholesterol diet.   Recheck labs on 05/17/24. Please call and schedule lab appt for this as well as vitamin d .  F/u with me in office in 6 months.   It was a pleasure to see you today!

## 2024-04-27 NOTE — Assessment & Plan Note (Signed)
 Uncontrolled.    Reviewed labs with patient at length, over the years. Handout provided to help lower cholesterol. Declines medication at this time. Discussed the risks of elevated cholesterol and ASCVD risk.   Will recheck lipid panel in 4 weeks.

## 2024-04-27 NOTE — Assessment & Plan Note (Addendum)
 Discussed and reviewed labs from over the years with patient.  Continue levothyroxine  150 mcg. Repeat TSH level in 4 weeks. We discussed possible dosage change of his numbers are in the abnormal range.

## 2024-04-28 ENCOUNTER — Ambulatory Visit (INDEPENDENT_AMBULATORY_CARE_PROVIDER_SITE_OTHER): Admitting: Psychology

## 2024-04-28 DIAGNOSIS — F4310 Post-traumatic stress disorder, unspecified: Secondary | ICD-10-CM | POA: Diagnosis not present

## 2024-04-28 DIAGNOSIS — F411 Generalized anxiety disorder: Secondary | ICD-10-CM

## 2024-04-28 NOTE — Progress Notes (Addendum)
 [facility] Counselor/Therapist Progress Note  Patient ID: Robert Mathews, MRN: [id number]    Date: 04/28/24  Time Spent: 10:03 am - 10:58 : 55  minutes  Treatment Type: Individual Therapy.  Reported Symptoms:  Anxiety: Feeling nervous, anxious or on edge, not being able to stop or control worrying, trouble relaxing, being so restless that it is hard to sit still, becoming easily annoyed or irritable. Depression: little interest or pleasure in doing things, feeling down, depressed, or hopeless, trouble falling or staying asleep, or sleeping too much, feeling tired or having little energy.Trouble concentrating on things, such as reading the newspaper or watching television.    Mental Status Exam: Appearance:  Well Groomed     Behavior: Appropriate  Motor: Normal  Speech/Language:  Normal Rate  Affect: Appropriate  Mood: normal  Thought process: normal  Thought content:   WNL  Sensory/Perceptual disturbances:   WNL  Orientation: oriented to person, place, and time/date  Attention: Good  Concentration: Good  Memory: WNL  Fund of knowledge:  Good  Insight:   Good  Judgment:  Good  Impulse Control: Good   Risk Assessment: Danger to Self:  No Self-injurious Behavior: No Danger to Others: No Duty to Warn:no Physical Aggression / Violence:No  Access to Firearms a concern: No  Gang Involvement:No   Subjective:   Robert Mathews participated from home, via video, and consented to treatment. I discussed the limitations of evaluation and management by telemedicine and the availability of in person appointments. The patient expressed understanding and agreed to proceed.  Therapist participated from home office.  [place] reviewed the events of the past week.   Patient reported that he didn't act like a normal person when he was frustrated due to being out of control. We did an after action review (aar) of the events from last Wednesday (MRI appt/Dr not showing up/ coworker having to go  to plan B). Auto sequencing//Stop and Think// he'll make a sticker.  Next appt 2 weeks at 10am.  Interventions: Cognitive Behavioral Therapy  Diagnosis:  Posttraumatic stress disorder [F43.10],  Generalized Anxiety Disorder (GAD) 41.1  Psychiatric Treatment: Yes , in Oct. 2024, there was this inpatient thing at the [facility], but I don't remember too much about that.   Treatment Plan:  Client Abilities/Strengths Robert Mathews is determined, a self advocate, and motivated to learn more about managing stress in his life.    Support System: Daughter and friends in [place] since 2007  Client Treatment Preferences OPT  Client Statement of Needs Robert Mathews would like to get back to where he was one month ago, get his quality of life back, manage his stress more effectively    Treatment Level Biweekly  Symptoms  Anxiety: Feeling nervous, anxious or on edge, not being able to stop or control worrying, trouble relaxing, being so restless that it is hard to sit still, becoming easily annoyed or irritable. Depression: little interest or pleasure in doing things, feeling down, depressed, or hopeless, trouble falling or staying asleep, or sleeping too much, feeling tired or having little energy.Trouble concentrating on things, such as reading the newspaper or watching television.   Goals:   [place] experiences symptoms of depression and anxiety.   Target Date: 03/03/25 Frequency: Weekly/Biweekly  Progress: 0 Modality: individual    Therapist will provide referrals for additional resources as appropriate.  Therapist will provide psycho-education regarding Jarmon's diagnosis and corresponding treatment approaches and interventions. [name] will support the patient's ability to achieve the goals identified. will employ  CBT, BA, Problem-solving, Solution Focused, Mindfulness,  coping skills, & other evidenced-based practices will be used to promote progress towards healthy functioning to help manage decrease symptoms  associated with their diagnosis.   Reduce overall level, frequency, and intensity of the feelings of depression, anxiety and panic evidenced by decreased overall symptoms from 6 to 7 days/week to 0 to 1 days/week per client report for at least 3 consecutive months. Verbally express understanding of the relationship between feelings of depression, and anxiety and their impact on thinking patterns and behaviors. Verbalize an understanding of the role that distorted thinking plays in creating fears, excessive worry, and ruminations.  [place] participated in the creation of the treatment plan). Patient signed off on treatment plan, please see chart.    [name]

## 2024-05-05 DIAGNOSIS — Z6830 Body mass index (BMI) 30.0-30.9, adult: Secondary | ICD-10-CM | POA: Diagnosis not present

## 2024-05-05 DIAGNOSIS — M47812 Spondylosis without myelopathy or radiculopathy, cervical region: Secondary | ICD-10-CM | POA: Diagnosis not present

## 2024-05-12 ENCOUNTER — Ambulatory Visit: Admitting: Psychology

## 2024-05-12 DIAGNOSIS — F411 Generalized anxiety disorder: Secondary | ICD-10-CM | POA: Diagnosis not present

## 2024-05-12 DIAGNOSIS — F4310 Post-traumatic stress disorder, unspecified: Secondary | ICD-10-CM

## 2024-05-12 NOTE — Progress Notes (Signed)
 [facility] Counselor/Therapist Progress Note  Patient ID: Robert Mathews, MRN: [id number]    Date: 05/12/24  Time Spent: 10:00 am - 10:53 am :53  minutes  Treatment Type: Individual Therapy.  Reported Symptoms: Anxiety: Feeling nervous, anxious or on edge, not being able to stop or control worrying, trouble relaxing, being so restless that it is hard to sit still, becoming easily annoyed or irritable. Depression: little interest or pleasure in doing things, feeling down, depressed, or hopeless, trouble falling or staying asleep, or sleeping too much, feeling tired or having little energy.Trouble concentrating on things, such as reading the newspaper or watching television.    Mental Status Exam: Appearance:  Casual     Behavior: Appropriate  Motor: Normal  Speech/Language:  Normal Rate  Affect: Appropriate  Mood: normal  Thought process: normal  Thought content:   WNL  Sensory/Perceptual disturbances:   WNL  Orientation: oriented to person, place, and time/date  Attention: Good  Concentration: Good  Memory: WNL  Fund of knowledge:  Good  Insight:   Good  Judgment:  Good  Impulse Control: Good   Risk Assessment: Danger to Self:  No Self-injurious Behavior: No Danger to Others: No Duty to Warn:no Physical Aggression / Violence:No  Access to Firearms a concern: No  Gang Involvement:No   Subjective:   Robert Mathews participated from home, via video, and consented to treatment. I discussed the limitations of evaluation and management by telemedicine and the availability of in person appointments. The patient expressed understanding and agreed to proceed.  Therapist participated from home office.  Robert reviewed the events of the past week.   Patient's house lease is up in the fall 2025 in [place], plans to move [place]. Patient's daughter lives in [place], but pt hopes that daughter will know she has a place in [place] if she needs it. Patient is waiting to hear back  from complex if he can stay until November 2025 due to Archery season. Patient gave himself sailing lessons for Father's Day.   Interventions: Cognitive Behavioral Therapy  Diagnosis:  Posttraumatic stress disorder [F43.10],  Generalized Anxiety Disorder (GAD) 41.1  Psychiatric Treatment:  Yes , in Oct. 2024, there was this inpatient thing at the [facility], but I don't remember too much about that.   Treatment Plan:  Client Abilities/Strengths Robert Mathews is determined, a self advocate, and motivated to learn more about managing stress in his life.    Support System: Daughter and friends in [place] since 2007  Client Treatment Preferences OPT  Client Statement of Needs [place] would like to get back to where he was one month ago, get his quality of life back, manage his stress more effectively     Treatment Level Biweekly  Symptoms  Anxiety: Feeling nervous, anxious or on edge, not being able to stop or control worrying, trouble relaxing, being so restless that it is hard to sit still, becoming easily annoyed or irritable. Depression: little interest or pleasure in doing things, feeling down, depressed, or hopeless, trouble falling or staying asleep, or sleeping too much, feeling tired or having little energy.Trouble concentrating on things, such as reading the newspaper or watching television.   Goals:   Robert experiences symptoms of anxiety and depression.   Target Date: 03/03/25 Frequency: Biweekly  Progress: 0 Modality: individual    Therapist will provide referrals for additional resources as appropriate.  Therapist will provide psycho-education regarding Winn's diagnosis and corresponding treatment approaches and interventions. Jenkins CHRISTELLA Nicolas will support the patient's ability to achieve  the goals identified. will employ CBT, BA, Problem-solving, Solution Focused, Mindfulness,  coping skills, & other evidenced-based practices will be used to promote progress towards healthy functioning to help  manage decrease symptoms associated with their diagnosis.   Reduce overall level, frequency, and intensity of the feelings of depression, anxiety and panic evidenced by decreased overall symptoms from 6 to 7 days/week to 0 to 1 days/week per client report for at least 3 consecutive months. Verbally express understanding of the relationship between feelings of depression and anxiety and their impact on thinking patterns and behaviors. Verbalize an understanding of the role that distorted thinking plays in creating fears, excessive worry, and ruminations.  [place] participated in the creation of the treatment plan)    Jenkins CHRISTELLA Nicolas

## 2024-05-26 ENCOUNTER — Ambulatory Visit (INDEPENDENT_AMBULATORY_CARE_PROVIDER_SITE_OTHER): Admitting: Psychology

## 2024-05-26 DIAGNOSIS — F4310 Post-traumatic stress disorder, unspecified: Secondary | ICD-10-CM | POA: Diagnosis not present

## 2024-05-26 DIAGNOSIS — F411 Generalized anxiety disorder: Secondary | ICD-10-CM

## 2024-05-26 NOTE — Progress Notes (Signed)
 [facility] Counselor/Therapist Progress Note  Patient ID: Robert Mathews, MRN: [id number]    Date: 05/26/24  Time Spent: 4:00 pm - 4:57 pm  :57 minutes (we had a few minutes of technical difficulties but they resolved).  Treatment Type: Individual Therapy.  Reported Symptoms:  Anxiety: Feeling nervous, anxious or on edge, not being able to stop or control worrying, trouble relaxing, being so restless that it is hard to sit still, becoming easily annoyed or irritable. Depression: little interest or pleasure in doing things, feeling down, depressed, or hopeless, trouble falling or staying asleep, or sleeping too much, feeling tired or having little energy.Trouble concentrating on things, such as reading the newspaper or watching television.   Mental Status Exam: Appearance:  Well Groomed     Behavior: Appropriate  Motor: Normal  Speech/Language:  Normal Rate  Affect: Appropriate  Mood: normal  Thought process: normal  Thought content:   WNL  Sensory/Perceptual disturbances:   WNL  Orientation: oriented to person, place, and time/date  Attention: Good  Concentration: Good  Memory: WNL  Fund of knowledge:  Good  Insight:   Good  Judgment:  Good  Impulse Control: Good   Risk Assessment: Danger to Self:  No Self-injurious Behavior: No Danger to Others: No Duty to Warn:no Physical Aggression / Violence:No  Access to Firearms a concern: No  Gang Involvement:No   Subjective:   Robert Mathews participated from office, via video, and consented to treatment. I discussed the limitations of evaluation and management by telemedicine and the availability of in person appointments. The patient expressed understanding and agreed to proceed.  Therapist participated from home office.  Robert reviewed the events of the past week.   Patient happened to mention that Robert Mathews is his ex-wife, when in actuality they are still married, and are only separated.  I have six figure salary and my  retirement is fully funded by the [facility] scheduled for 7/28 PT starts week of 7/14 Prefers to be called Robert Mathews.   Patient reported that his wife is way smarter than me, but she doesn't push herself/relies on me for everything. Pt's wife is 5 years older than him. Patient dated someone over spring break  Interventions: Cognitive Behavioral Therapy  Diagnosis:  Posttraumatic stress disorder [F43.10],  Generalized Anxiety Disorder (GAD) 41.1  Psychiatric Treatment:  Yes , in Oct. 2024, there was this inpatient thing at the [facility], but I don't remember too much about that.   Treatment Plan:  Client Abilities/Strengths Robert Mathews is determined, a self advocate, and motivated to learn more about managing stress in his life.   Support System: Daughter and friends in [place] since 2007  Client Treatment Preferences OPT  Client Statement of Needs Robert Mathews would like to is determined, a self advocate, and motivated to learn more about managing stress in his life.    Treatment Level Biweekly  Symptoms  Anxiety: Feeling nervous, anxious or on edge, not being able to stop or control worrying, trouble relaxing, being so restless that it is hard to sit still, becoming easily annoyed or irritable. Depression: little interest or pleasure in doing things, feeling down, depressed, or hopeless, trouble falling or staying asleep, or sleeping too much, feeling tired or having little energy.Trouble concentrating on things, such as reading the newspaper or watching television.   Goals:   Robert experiences symptoms of anxiety and depression.   Target Date: 03/03/25 Frequency: Biweekly  Progress: 0 Modality: individual    Therapist will provide referrals for additional resources as  appropriate.  Therapist will provide psycho-education regarding Robert Mathews's diagnosis and corresponding treatment approaches and interventions. Jenkins CHRISTELLA Nicolas will support the patient's ability to achieve the goals identified. will employ  CBT, BA, Problem-solving, Solution Focused, Mindfulness,  coping skills, & other evidenced-based practices will be used to promote progress towards healthy functioning to help manage decrease symptoms associated with their diagnosis.   Reduce overall level, frequency, and intensity of the feelings of depression, anxiety and panic evidenced by decreased overall symptoms from 6 to 7 days/week to 0 to 1 days/week per client report for at least 3 consecutive months. Verbally express understanding of the relationship between feelings of depression and anxiety and their impact on thinking patterns and behaviors. Verbalize an understanding of the role that distorted thinking plays in creating fears, excessive worry, and ruminations.  Robert Mathews participated in the creation of the treatment plan)    Jenkins CHRISTELLA Nicolas

## 2024-06-02 DIAGNOSIS — M4722 Other spondylosis with radiculopathy, cervical region: Secondary | ICD-10-CM | POA: Diagnosis not present

## 2024-06-09 ENCOUNTER — Ambulatory Visit: Admitting: Psychology

## 2024-06-09 DIAGNOSIS — M4722 Other spondylosis with radiculopathy, cervical region: Secondary | ICD-10-CM | POA: Diagnosis not present

## 2024-06-11 DIAGNOSIS — M4722 Other spondylosis with radiculopathy, cervical region: Secondary | ICD-10-CM | POA: Diagnosis not present

## 2024-06-14 DIAGNOSIS — M5412 Radiculopathy, cervical region: Secondary | ICD-10-CM | POA: Diagnosis not present

## 2024-06-16 DIAGNOSIS — M4722 Other spondylosis with radiculopathy, cervical region: Secondary | ICD-10-CM | POA: Diagnosis not present

## 2024-06-18 ENCOUNTER — Other Ambulatory Visit: Payer: Self-pay | Admitting: General Practice

## 2024-06-18 DIAGNOSIS — E559 Vitamin D deficiency, unspecified: Secondary | ICD-10-CM

## 2024-06-25 DIAGNOSIS — M4722 Other spondylosis with radiculopathy, cervical region: Secondary | ICD-10-CM | POA: Diagnosis not present

## 2024-06-28 DIAGNOSIS — M4722 Other spondylosis with radiculopathy, cervical region: Secondary | ICD-10-CM | POA: Diagnosis not present

## 2024-07-15 DIAGNOSIS — M4722 Other spondylosis with radiculopathy, cervical region: Secondary | ICD-10-CM | POA: Diagnosis not present

## 2024-08-13 ENCOUNTER — Ambulatory Visit: Payer: Self-pay | Admitting: General Practice

## 2024-08-13 ENCOUNTER — Encounter: Payer: Self-pay | Admitting: General Practice

## 2024-08-13 ENCOUNTER — Ambulatory Visit (INDEPENDENT_AMBULATORY_CARE_PROVIDER_SITE_OTHER): Admitting: General Practice

## 2024-08-13 VITALS — BP 130/80 | HR 73 | Temp 98.1°F | Ht 75.25 in | Wt 233.4 lb

## 2024-08-13 DIAGNOSIS — Z113 Encounter for screening for infections with a predominantly sexual mode of transmission: Secondary | ICD-10-CM

## 2024-08-13 DIAGNOSIS — Z23 Encounter for immunization: Secondary | ICD-10-CM | POA: Diagnosis not present

## 2024-08-13 DIAGNOSIS — E782 Mixed hyperlipidemia: Secondary | ICD-10-CM

## 2024-08-13 DIAGNOSIS — E039 Hypothyroidism, unspecified: Secondary | ICD-10-CM

## 2024-08-13 DIAGNOSIS — E559 Vitamin D deficiency, unspecified: Secondary | ICD-10-CM

## 2024-08-13 LAB — LIPID PANEL
Cholesterol: 261 mg/dL — ABNORMAL HIGH (ref 0–200)
HDL: 45.3 mg/dL (ref >39.00)
LDL Cholesterol: 175 mg/dL — ABNORMAL HIGH (ref 0–99)
NonHDL: 215.71
Total CHOL/HDL Ratio: 6
Triglycerides: 204 mg/dL — ABNORMAL HIGH (ref 0.0–149.0)
VLDL: 40.8 mg/dL — ABNORMAL HIGH (ref 0.0–40.0)

## 2024-08-13 LAB — TSH: TSH: 0.89 u[IU]/mL (ref 0.35–5.50)

## 2024-08-13 LAB — VITAMIN D 25 HYDROXY (VIT D DEFICIENCY, FRACTURES): VITD: 36.68 ng/mL (ref 30.00–100.00)

## 2024-08-13 NOTE — Patient Instructions (Signed)
 Stop by the lab prior to leaving today. I will notify you of your results once received.   Follow up in 6 months.   It was a pleasure to see you today!

## 2024-08-13 NOTE — Progress Notes (Signed)
 Established Patient Office Visit  Subjective   Patient ID: Robert Mathews, male    DOB: [birth year]  Age: 48 y.o. MRN: [id number]  Chief Complaint  Patient presents with   vitamin D      Patient here to have Vitamin D  rechecked after finishing rx of Vitamin D  x 12 weeks.    STD screening    Patient would like STD panel done; no sx today just wants done to check.     HPI  Robert Mathews is a 48 year old male with past medical history of HTN, hypothyroidism, HLD, PTSD, vitamin d  deficency, GAD presents today for a follow up.   Discussed the use of AI scribe software for clinical note transcription with the patient, who gave verbal consent to proceed.  History of Present Illness He has a history of hyperlipidemia with previous triglyceride levels at 782 mg/dL and total cholesterol at 281 mg/dL. He has been monitoring his diet and exercising regularly, including swimming and gym workouts, although he has been less active this week due to being on campus.  He has thyroid  issues with fluctuating levels, with a previous dip to 0.54. He was initially on Synthroid  but was switched to generic levothyroxine  by the [facility] in the spring, taking 150 mcg once daily. He is concerned about the change in medication affecting his thyroid  levels.  He is also here for an STI screening as a preventative measure, despite not having any symptoms. He mentions having had a couple of partners and a past partner with a history of HPV. No cold sores in decades.  He has been taking vitamin D  supplements, specifically 4000 IU daily, and has completed his previous course.    Patient Active Problem List   Diagnosis Date Noted   Establishing care with new doctor, encounter for 04/19/2024   Vitamin D  deficiency 04/19/2024   Routine screening for STI (sexually transmitted infection) 04/19/2024   Acute neck pain 02/06/2024   Skin rash 08/26/2023   Cervical radiculopathy 04/10/2023   Essential hypertension 04/09/2022    Elevated blood pressure reading 03/26/2022   Current moderate episode of major depressive disorder without prior episode (HCC) 02/20/2022   Generalized anxiety disorder 02/20/2022   Neuropathy 07/17/2020   Chronic pain 10/31/2015   Foraminal stenosis of lumbar region 11/19/2013   DDD (degenerative disc disease) 11/19/2013   Radiculopathy of lumbar region 11/19/2013   Chronic back pain 07/15/2013   Post traumatic stress disorder (PTSD) 12/16/2011   Hypothyroidism 03/08/2009   HYPERLIPIDEMIA 09/22/2007   DYSPLASTIC NEVUS, BACK 06/18/2007   Past Medical History:  Diagnosis Date   Arthritis    right knee   Chronic back pain    Depression    Dermatitis due to plant    Hypothyroidism    Kidney stones    Neuropathy    both legs   PNA (pneumonia)    2 times   PTSD (post-traumatic stress disorder)    Radius fracture    TBI (traumatic brain injury) (HCC)    Past Surgical History:  Procedure Laterality Date   arm surgery     put under anesthesia for resetting of fracture   CARPAL TUNNEL RELEASE Right 09/2015   mole removed     REFRACTIVE SURGERY Bilateral    SPINAL CORD STIMULATOR INSERTION N/A 10/31/2015   Procedure: SPINAL CORD STIMULATOR INSERTION;  Surgeon: Fairy Levels, MD;  Location: [facility];  Service: Neurosurgery;  Laterality: N/A;  LUMBAR SPINAL CORD STIMULATOR INSERTION   Allergies  Allergen Reactions   Topamax [Topiramate] Other (See Comments)    irritable           08/13/2024   10:59 AM 04/27/2024    9:41 AM 04/19/2024    9:31 AM  Depression screen PHQ 2/9  Decreased Interest 0 0 0  Down, Depressed, Hopeless 0 1 2  PHQ - 2 Score 0 1 2  Altered sleeping 0  2  Tired, decreased energy 0  1  Change in appetite 0  0  Feeling bad or failure about yourself  0  0  Trouble concentrating 1  0  Moving slowly or fidgety/restless 0  0  Suicidal thoughts 0  0  PHQ-9 Score 1  5  Difficult doing work/chores Not difficult at all  Not difficult at all        08/13/2024   10:59 AM 04/19/2024    9:31 AM 08/26/2023   11:55 AM 05/13/2022   11:19 AM  GAD 7 : Generalized Anxiety Score  Nervous, Anxious, on Edge 0 1 1 1   Control/stop worrying 0 0 0 0  Worry too much - different things 0 1 0 0  Trouble relaxing 1 0 0 1  Restless 0 0 0 0  Easily annoyed or irritable 1 1 1 1   Afraid - awful might happen 0 0 0 0  Total GAD 7 Score 2 3 2 3   Anxiety Difficulty Not difficult at all Somewhat difficult Not difficult at all       Review of Systems  Constitutional:  Negative for chills and fever.  Respiratory:  Negative for shortness of breath.   Cardiovascular:  Negative for chest pain.  Gastrointestinal:  Negative for abdominal pain, constipation, diarrhea, heartburn, nausea and vomiting.  Genitourinary:  Negative for dysuria, frequency and urgency.  Neurological:  Negative for dizziness and headaches.  Endo/Heme/Allergies:  Negative for polydipsia.  Psychiatric/Behavioral:  Negative for depression and suicidal ideas. The patient is not nervous/anxious.       Objective:     BP 130/80   Pulse 73   Temp 98.1 F (36.7 C) (Temporal)   Ht 6' 3.25 (1.911 m)   Wt 233 lb 6.4 oz (105.9 kg)   SpO2 97%   BMI 28.98 kg/m  BP Readings from Last 3 Encounters:  08/13/24 130/80  04/19/24 124/80  02/06/24 102/78   Wt Readings from Last 3 Encounters:  08/13/24 233 lb 6.4 oz (105.9 kg)  04/19/24 247 lb (112 kg)  02/06/24 241 lb 8 oz (109.5 kg)      Physical Exam Vitals and nursing note reviewed.  Constitutional:      Appearance: Normal appearance.  Cardiovascular:     Rate and Rhythm: Normal rate and regular rhythm.     Pulses: Normal pulses.     Heart sounds: Normal heart sounds.  Pulmonary:     Effort: Pulmonary effort is normal.     Breath sounds: Normal breath sounds.  Neurological:     Mental Status: He is alert and oriented to person, place, and time.  Psychiatric:        Mood and Affect: Mood normal.        Behavior: Behavior normal.         Thought Content: Thought content normal.        Judgment: Judgment normal.      No results found for any visits on 08/13/24.     The 10-year ASCVD risk score (Arnett DK, et al., 2019) is: 6.1%  Assessment & Plan:  HYPERLIPIDEMIA -     Lipid panel  Vitamin D  deficiency -     VITAMIN D  25 Hydroxy (Vit-D Deficiency, Fractures)  Routine screening for STI (sexually transmitted infection) -     RPR -     HIV Antibody (routine testing w rflx) -     Hepatitis C antibody -     Chlamydia/Gonococcus/Trichomonas, NAA -     HSV(herpes simplex vrs) 1+2 ab-IgG  Acquired hypothyroidism -     TSH    Assessment and Plan Assessment & Plan Screening for sexually transmitted infections Requested due to recent multiple partners. No current symptoms. Concern about potential HPV exposure, though aware HPV cannot be tested in men. - STI panel pending.  Hyperlipidemia Previously elevated triglycerides at 782 mg/dL and total cholesterol at 281 mg/dL. Monitoring diet and exercising inconsistently due to being on campus. No medication initiated yet. - Recheck lipid panel today to assess current levels. - Schedule follow-up in six months with fasting labs.  Acquired hypothyroidism Previous fluctuations in thyroid  levels. Currently on levothyroxine  150 mcg daily, switched from Synthroid  due to [facility] cost considerations. Previous TSH level was 0.54, indicating potential over-replacement. - Recheck thyroid  function tests today to assess current levels.  Vitamin D  deficiency Previously treated with ergocalciferol . Completed prescribed course and now taking 4000 IU of vitamin D  daily. - Recheck vitamin D  levels today to assess current status.   Return in about 6 months (around 02/10/2025) for physical and fasting labs.SABRA Carrol Aurora, NP

## 2024-08-16 LAB — HIV ANTIBODY (ROUTINE TESTING W REFLEX)
HIV 1&2 Ab, 4th Generation: NONREACTIVE
HIV FINAL INTERPRETATION: NEGATIVE

## 2024-08-16 LAB — HERPES SIMPLEX VIRUS 1 AND 2 (IGG),REFLEX HSV-2 INHIBITION
HSV 1 IGG,TYPE SPECIFIC AB: 25.7 {index} — ABNORMAL HIGH
HSV 2 IGG,TYPE SPECIFIC AB: <0.9 {index}

## 2024-08-16 LAB — RPR: RPR Ser Ql: NONREACTIVE

## 2024-08-16 LAB — HEPATITIS C ANTIBODY: Hepatitis C Ab: NONREACTIVE

## 2024-08-17 ENCOUNTER — Ambulatory Visit: Admitting: General Practice

## 2024-08-17 LAB — CHLAMYDIA/GONOCOCCUS/TRICHOMONAS, NAA
Chlamydia by NAA: NEGATIVE
Gonococcus by NAA: NEGATIVE
Trich vag by NAA: NEGATIVE

## 2024-08-26 ENCOUNTER — Encounter: Payer: Self-pay | Admitting: General Practice

## 2024-08-26 ENCOUNTER — Ambulatory Visit: Admitting: General Practice

## 2024-08-26 VITALS — BP 134/74 | HR 82 | Temp 98.1°F | Ht 75.5 in | Wt 233.0 lb

## 2024-08-26 DIAGNOSIS — L739 Follicular disorder, unspecified: Secondary | ICD-10-CM

## 2024-08-26 MED ORDER — DOXYCYCLINE HYCLATE 100 MG PO TABS: 100.0000 mg | ORAL_TABLET | Freq: Two times a day (BID) | ORAL | 0 refills | Status: AC

## 2024-08-26 NOTE — Patient Instructions (Addendum)
 Start Doxycycline antibiotic for the infection. Take 1 tablet by mouth twice daily for 10 days.  Follow up if symptoms worsen or if not better.   It was a pleasure to see you today!

## 2024-08-26 NOTE — Progress Notes (Signed)
 Established Patient Office Visit  Subjective   Patient ID: Robert Mathews, male    DOB: 09-19-1976  Age: 48 y.o. MRN: [id number]  Chief Complaint  Patient presents with   Rash    On back x 3-4 weeks. Patient states its not very itchy; just red bumps present. Has not really put anything on it just tries to keep it clean.     HPI  Refujio Haymer is a 48 year old male with past medical history of HLD, HTN, hypothyroidism, GAD presents today for an acute visit.   Discussed the use of AI scribe software for clinical note transcription with the patient, who gave verbal consent to proceed.  History of Present Illness Arend Bahl is a 48 year old male who presents with a persistent rash on his back.  He has had a rash on his back for three to four weeks, which is not intensely itchy but has not resolved. The rash appeared after the second or third waxing session and is located in the area where he previously underwent waxing. He has not waxed in approximately six weeks due to the rash.  He attempted to change his detergent, but the rash is not present elsewhere on his body. The rash consists of red dots.The rash does not cause pain but can become slightly itchy when fabric rubs against it.  He is not experiencing fevers, chills, nausea, vomiting, or chest pain.     Patient Active Problem List   Diagnosis Date Noted   Establishing care with new doctor, encounter for 04/19/2024   Vitamin D  deficiency 04/19/2024   Routine screening for STI (sexually transmitted infection) 04/19/2024   Acute neck pain 02/06/2024   Skin rash 08/26/2023   Cervical radiculopathy 04/10/2023   Essential hypertension 04/09/2022   Elevated blood pressure reading 03/26/2022   Current moderate episode of major depressive disorder without prior episode (HCC) 02/20/2022   Generalized anxiety disorder 02/20/2022   Neuropathy 07/17/2020   Chronic pain 10/31/2015   Foraminal stenosis of lumbar region 11/19/2013    DDD (degenerative disc disease) 11/19/2013   Radiculopathy of lumbar region 11/19/2013   Chronic back pain 07/15/2013   Post traumatic stress disorder (PTSD) 12/16/2011   Hypothyroidism 03/08/2009   HYPERLIPIDEMIA 09/22/2007   DYSPLASTIC NEVUS, BACK 06/18/2007   Past Medical History:  Diagnosis Date   Arthritis    right knee   Chronic back pain    Depression    Dermatitis due to plant    Hypothyroidism    Kidney stones    Neuropathy    both legs   PNA (pneumonia)    2 times   PTSD (post-traumatic stress disorder)    Radius fracture    TBI (traumatic brain injury) (HCC)    Past Surgical History:  Procedure Laterality Date   arm surgery     put under anesthesia for resetting of fracture   CARPAL TUNNEL RELEASE Right 09/2015   mole removed     REFRACTIVE SURGERY Bilateral    SPINAL CORD STIMULATOR INSERTION N/A 10/31/2015   Procedure: SPINAL CORD STIMULATOR INSERTION;  Surgeon: Fairy Levels, MD;  Location: [facility];  Service: Neurosurgery;  Laterality: N/A;  LUMBAR SPINAL CORD STIMULATOR INSERTION   Allergies  Allergen Reactions   Topamax [Topiramate] Other (See Comments)    irritable           08/26/2024    9:26 AM 08/13/2024   10:59 AM 04/27/2024    9:41 AM  Depression screen  PHQ 2/9  Decreased Interest 0 0 0  Down, Depressed, Hopeless 0 0 1  PHQ - 2 Score 0 0 1  Altered sleeping 0 0   Tired, decreased energy 0 0   Change in appetite 0 0   Feeling bad or failure about yourself  0 0   Trouble concentrating 1 1   Moving slowly or fidgety/restless 0 0   Suicidal thoughts 1 0   PHQ-9 Score 2 1   Difficult doing work/chores Somewhat difficult Not difficult at all        08/26/2024    9:26 AM 08/13/2024   10:59 AM 04/19/2024    9:31 AM 08/26/2023   11:55 AM  GAD 7 : Generalized Anxiety Score  Nervous, Anxious, on Edge 1 0 1 1  Control/stop worrying 0 0 0 0  Worry too much - different things 1 0 1 0  Trouble relaxing 1 1 0 0  Restless 0 0 0 0  Easily  annoyed or irritable 1 1 1 1   Afraid - awful might happen 0 0 0 0  Total GAD 7 Score 4 2 3 2   Anxiety Difficulty Somewhat difficult Not difficult at all Somewhat difficult Not difficult at all      Review of Systems  Constitutional:  Negative for chills and fever.  Respiratory:  Negative for shortness of breath.   Cardiovascular:  Negative for chest pain.  Gastrointestinal:  Negative for abdominal pain, constipation, diarrhea, heartburn, nausea and vomiting.  Genitourinary:  Negative for dysuria, frequency and urgency.  Skin:  Positive for rash.  Neurological:  Negative for dizziness and headaches.  Endo/Heme/Allergies:  Negative for polydipsia.  Psychiatric/Behavioral:  Negative for depression and suicidal ideas. The patient is not nervous/anxious.       Objective:     BP 134/74   Pulse 82   Temp 98.1 F (36.7 C) (Oral)   Ht 6' 3.5 (1.918 m)   Wt 233 lb (105.7 kg)   SpO2 96%   BMI 28.74 kg/m  BP Readings from Last 3 Encounters:  08/26/24 134/74  08/13/24 130/80  04/19/24 124/80   Wt Readings from Last 3 Encounters:  08/26/24 233 lb (105.7 kg)  08/13/24 233 lb 6.4 oz (105.9 kg)  04/19/24 247 lb (112 kg)      Physical Exam Vitals and nursing note reviewed.  Constitutional:      Appearance: Normal appearance.  Cardiovascular:     Rate and Rhythm: Normal rate and regular rhythm.     Pulses: Normal pulses.     Heart sounds: Normal heart sounds.  Pulmonary:     Effort: Pulmonary effort is normal.     Breath sounds: Normal breath sounds.  Neurological:     Mental Status: He is alert and oriented to person, place, and time.  Psychiatric:        Mood and Affect: Mood normal.        Behavior: Behavior normal.        Thought Content: Thought content normal.        Judgment: Judgment normal.         No results found for any visits on 08/26/24.     The 10-year ASCVD risk score (Arnett DK, et al., 2019) is: 5.5%    Assessment & Plan:  Folliculitis -      Doxycycline Hyclate; Take 1 tablet (100 mg total) by mouth 2 (two) times daily for 10 days.  Dispense: 20 tablet; Refill: 0    Assessment and  Plan Assessment & Plan Folliculitis of back Folliculitis likely due to waxing, with potential for lesion development. No systemic symptoms.  - Prescribe doxycycline 100 mg orally twice daily for 10 days. - Advise sun protection with sunscreen and protective clothing while on doxycycline. - Instruct to complete full antibiotic course even if symptoms improve. - Advise monitoring for worsening symptoms like weeping or discharge,  - Recommend moisturizing the back and staying hydrated. - Advise avoiding waxing until resolution. - Consider dermatology referral if no improvement or worsening.   Return if symptoms worsen or fail to improve.    Carrol Aurora, NP

## 2025-02-10 ENCOUNTER — Ambulatory Visit: Admitting: General Practice
